# Patient Record
Sex: Female | Born: 1975 | Race: White | Hispanic: No | Marital: Married | Smoking: Never smoker
Health system: Southern US, Community
[De-identification: ages and names within clinical notes are randomized; demographics above are authoritative.]

## PROBLEM LIST (undated history)

## (undated) ENCOUNTER — Inpatient Hospital Stay (HOSPITAL_COMMUNITY): Payer: Self-pay

## (undated) DIAGNOSIS — F329 Major depressive disorder, single episode, unspecified: Secondary | ICD-10-CM

## (undated) DIAGNOSIS — R51 Headache: Secondary | ICD-10-CM

## (undated) DIAGNOSIS — Z8619 Personal history of other infectious and parasitic diseases: Secondary | ICD-10-CM

## (undated) DIAGNOSIS — Q6689 Other  specified congenital deformities of feet: Secondary | ICD-10-CM

## (undated) DIAGNOSIS — Z8759 Personal history of other complications of pregnancy, childbirth and the puerperium: Secondary | ICD-10-CM

## (undated) DIAGNOSIS — F32A Depression, unspecified: Secondary | ICD-10-CM

## (undated) DIAGNOSIS — O24419 Gestational diabetes mellitus in pregnancy, unspecified control: Secondary | ICD-10-CM

## (undated) HISTORY — DX: Headache: R51

## (undated) HISTORY — DX: Gestational diabetes mellitus in pregnancy, unspecified control: O24.419

## (undated) HISTORY — DX: Personal history of other infectious and parasitic diseases: Z86.19

## (undated) HISTORY — DX: Personal history of other complications of pregnancy, childbirth and the puerperium: Z87.59

## (undated) HISTORY — PX: EYE SURGERY: SHX253

## (undated) HISTORY — DX: Other specified congenital deformities of feet: Q66.89

---

## 2010-01-24 ENCOUNTER — Inpatient Hospital Stay (HOSPITAL_COMMUNITY): Admission: AD | Admit: 2010-01-24 | Discharge: 2010-01-26 | Payer: Self-pay | Admitting: Obstetrics and Gynecology

## 2010-02-11 ENCOUNTER — Ambulatory Visit: Admission: RE | Admit: 2010-02-11 | Discharge: 2010-02-11 | Payer: Self-pay | Admitting: Obstetrics and Gynecology

## 2010-12-09 LAB — CBC
HCT: 37.9 % (ref 36.0–46.0)
Hemoglobin: 10.5 g/dL — ABNORMAL LOW (ref 12.0–15.0)
MCHC: 34.7 g/dL (ref 30.0–36.0)
Platelets: 175 10*3/uL (ref 150–400)
RBC: 3.14 MIL/uL — ABNORMAL LOW (ref 3.87–5.11)
WBC: 10.2 10*3/uL (ref 4.0–10.5)
WBC: 12.2 10*3/uL — ABNORMAL HIGH (ref 4.0–10.5)

## 2012-09-21 NOTE — L&D Delivery Note (Signed)
Delivery Note At 2:38 AM a viable female was delivered via  (Presentation: ; Occiput Anterior).  APGAR: 8, 9; weight pending   Placenta status:spontaneously with 3 vessel cord , Spontaneous.  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 Blood Loss (mL): 30  Mom to postpartum.  Baby to nursery-stable.  Jaquelynn Wanamaker L 06/02/2013, 2:56 AM

## 2012-11-11 LAB — OB RESULTS CONSOLE GC/CHLAMYDIA
Chlamydia: NEGATIVE
Gonorrhea: NEGATIVE

## 2012-11-11 LAB — OB RESULTS CONSOLE HIV ANTIBODY (ROUTINE TESTING): HIV: NONREACTIVE

## 2012-11-11 LAB — OB RESULTS CONSOLE ANTIBODY SCREEN: Antibody Screen: NEGATIVE

## 2012-11-11 LAB — OB RESULTS CONSOLE RPR: RPR: NONREACTIVE

## 2012-11-11 LAB — OB RESULTS CONSOLE RUBELLA ANTIBODY, IGM: Rubella: IMMUNE

## 2013-01-09 ENCOUNTER — Other Ambulatory Visit (HOSPITAL_COMMUNITY): Payer: Self-pay | Admitting: Obstetrics and Gynecology

## 2013-01-09 DIAGNOSIS — Z3689 Encounter for other specified antenatal screening: Secondary | ICD-10-CM

## 2013-01-10 ENCOUNTER — Ambulatory Visit (HOSPITAL_COMMUNITY)
Admission: RE | Admit: 2013-01-10 | Discharge: 2013-01-10 | Disposition: A | Discharge status: Home / Self Care | Payer: BC Managed Care – PPO | Source: Ambulatory Visit | Attending: Obstetrics and Gynecology | Admitting: Obstetrics and Gynecology

## 2013-01-10 ENCOUNTER — Encounter (HOSPITAL_COMMUNITY): Payer: Self-pay

## 2013-01-10 DIAGNOSIS — O358XX0 Maternal care for other (suspected) fetal abnormality and damage, not applicable or unspecified: Secondary | ICD-10-CM | POA: Insufficient documentation

## 2013-01-10 DIAGNOSIS — O352XX0 Maternal care for (suspected) hereditary disease in fetus, not applicable or unspecified: Secondary | ICD-10-CM | POA: Insufficient documentation

## 2013-01-10 DIAGNOSIS — Z1389 Encounter for screening for other disorder: Secondary | ICD-10-CM | POA: Insufficient documentation

## 2013-01-10 DIAGNOSIS — O99210 Obesity complicating pregnancy, unspecified trimester: Secondary | ICD-10-CM | POA: Insufficient documentation

## 2013-01-10 DIAGNOSIS — Z363 Encounter for antenatal screening for malformations: Secondary | ICD-10-CM | POA: Insufficient documentation

## 2013-01-10 DIAGNOSIS — Z3689 Encounter for other specified antenatal screening: Secondary | ICD-10-CM

## 2013-01-10 DIAGNOSIS — E669 Obesity, unspecified: Secondary | ICD-10-CM | POA: Insufficient documentation

## 2013-01-16 ENCOUNTER — Other Ambulatory Visit: Payer: Self-pay

## 2013-05-01 DIAGNOSIS — Q6689 Other  specified congenital deformities of feet: Secondary | ICD-10-CM | POA: Insufficient documentation

## 2013-05-01 HISTORY — DX: Other specified congenital deformities of feet: Q66.89

## 2013-05-31 ENCOUNTER — Encounter (HOSPITAL_COMMUNITY): Payer: Self-pay | Admitting: *Deleted

## 2013-05-31 ENCOUNTER — Telehealth (HOSPITAL_COMMUNITY): Payer: Self-pay | Admitting: *Deleted

## 2013-05-31 NOTE — Telephone Encounter (Signed)
Preadmission screen  

## 2013-06-01 ENCOUNTER — Inpatient Hospital Stay (HOSPITAL_COMMUNITY)
Admission: AD | Admit: 2013-06-01 | Discharge: 2013-06-03 | DRG: 373 | Disposition: A | Discharge status: Home / Self Care | Payer: BC Managed Care – PPO | Source: Ambulatory Visit | Attending: Obstetrics and Gynecology | Admitting: Obstetrics and Gynecology

## 2013-06-01 ENCOUNTER — Encounter (HOSPITAL_COMMUNITY): Payer: Self-pay | Admitting: *Deleted

## 2013-06-01 DIAGNOSIS — O358XX0 Maternal care for other (suspected) fetal abnormality and damage, not applicable or unspecified: Secondary | ICD-10-CM | POA: Diagnosis present

## 2013-06-01 LAB — CBC
HCT: 35.7 % — ABNORMAL LOW (ref 36.0–46.0)
Hemoglobin: 12 g/dL (ref 12.0–15.0)
MCH: 30.5 pg (ref 26.0–34.0)
MCHC: 33.6 g/dL (ref 30.0–36.0)
RBC: 3.94 MIL/uL (ref 3.87–5.11)

## 2013-06-01 MED ORDER — PHENYLEPHRINE 40 MCG/ML (10ML) SYRINGE FOR IV PUSH (FOR BLOOD PRESSURE SUPPORT)
80.0000 ug | PREFILLED_SYRINGE | INTRAVENOUS | Status: DC | PRN
  Filled 2013-06-01: qty 5
  Filled 2013-06-01: qty 2

## 2013-06-01 MED ORDER — EPHEDRINE 5 MG/ML INJ
10.0000 mg | INTRAVENOUS | Status: DC | PRN
  Filled 2013-06-01: qty 2

## 2013-06-01 MED ORDER — OXYCODONE-ACETAMINOPHEN 5-325 MG PO TABS: 1.0000 | ORAL_TABLET | ORAL | Status: DC | PRN

## 2013-06-01 MED ORDER — PHENYLEPHRINE 40 MCG/ML (10ML) SYRINGE FOR IV PUSH (FOR BLOOD PRESSURE SUPPORT)
80.0000 ug | PREFILLED_SYRINGE | INTRAVENOUS | Status: DC | PRN
  Filled 2013-06-01: qty 2

## 2013-06-01 MED ORDER — IBUPROFEN 600 MG PO TABS: 600.0000 mg | ORAL_TABLET | Freq: Four times a day (QID) | ORAL | Status: DC | PRN

## 2013-06-01 MED ORDER — OXYTOCIN BOLUS FROM INFUSION: 500.0000 mL | INTRAVENOUS | Status: DC

## 2013-06-01 MED ORDER — ACETAMINOPHEN 325 MG PO TABS: 650.0000 mg | ORAL_TABLET | ORAL | Status: DC | PRN

## 2013-06-01 MED ORDER — OXYTOCIN 40 UNITS IN LACTATED RINGERS INFUSION - SIMPLE MED
62.5000 mL/h | INTRAVENOUS | Status: DC
  Administered 2013-06-02: 999 mL/h via INTRAVENOUS
  Filled 2013-06-01: qty 1000

## 2013-06-01 MED ORDER — LACTATED RINGERS IV SOLN: 500.0000 mL | Freq: Once | INTRAVENOUS | Status: DC

## 2013-06-01 MED ORDER — EPHEDRINE 5 MG/ML INJ
10.0000 mg | INTRAVENOUS | Status: DC | PRN
  Filled 2013-06-01: qty 4
  Filled 2013-06-01: qty 2

## 2013-06-01 MED ORDER — FLEET ENEMA 7-19 GM/118ML RE ENEM: 1.0000 | ENEMA | RECTAL | Status: DC | PRN

## 2013-06-01 MED ORDER — LACTATED RINGERS IV SOLN: 500.0000 mL | INTRAVENOUS | Status: DC | PRN

## 2013-06-01 MED ORDER — DIPHENHYDRAMINE HCL 50 MG/ML IJ SOLN: 12.5000 mg | INTRAMUSCULAR | Status: DC | PRN

## 2013-06-01 MED ORDER — LACTATED RINGERS IV SOLN
INTRAVENOUS | Status: DC
  Administered 2013-06-01: 23:00:00 via INTRAVENOUS

## 2013-06-01 MED ORDER — CITRIC ACID-SODIUM CITRATE 334-500 MG/5ML PO SOLN: 30.0000 mL | ORAL | Status: DC | PRN

## 2013-06-01 MED ORDER — FENTANYL 2.5 MCG/ML BUPIVACAINE 1/10 % EPIDURAL INFUSION (WH - ANES)
14.0000 mL/h | INTRAMUSCULAR | Status: DC | PRN
  Filled 2013-06-01: qty 125

## 2013-06-01 MED ORDER — LIDOCAINE HCL (PF) 1 % IJ SOLN
30.0000 mL | INTRAMUSCULAR | Status: DC | PRN
  Filled 2013-06-01 (×2): qty 30

## 2013-06-01 MED ORDER — ONDANSETRON HCL 4 MG/2ML IJ SOLN: 4.0000 mg | Freq: Four times a day (QID) | INTRAMUSCULAR | Status: DC | PRN

## 2013-06-01 NOTE — MAU Note (Signed)
PT SAYS SHE HURTS BAD SINCE 6 PM.    VE IN OFFICE ON Monday-  1 CM.    DENIES HSV AND MRSA.

## 2013-06-02 ENCOUNTER — Encounter (HOSPITAL_COMMUNITY): Payer: Self-pay | Admitting: Anesthesiology

## 2013-06-02 ENCOUNTER — Inpatient Hospital Stay (HOSPITAL_COMMUNITY): Payer: BC Managed Care – PPO | Admitting: Anesthesiology

## 2013-06-02 LAB — ABO/RH: ABO/RH(D): O POS

## 2013-06-02 LAB — TYPE AND SCREEN: ABO/RH(D): O POS

## 2013-06-02 LAB — RPR: RPR Ser Ql: NONREACTIVE

## 2013-06-02 MED ORDER — PRENATAL MULTIVITAMIN CH
1.0000 | ORAL_TABLET | Freq: Every day | ORAL | Status: DC
  Administered 2013-06-02 – 2013-06-03 (×2): 1 via ORAL
  Filled 2013-06-02 (×2): qty 1

## 2013-06-02 MED ORDER — DIPHENHYDRAMINE HCL 25 MG PO CAPS: 25.0000 mg | ORAL_CAPSULE | Freq: Four times a day (QID) | ORAL | Status: DC | PRN

## 2013-06-02 MED ORDER — LIDOCAINE HCL (PF) 1 % IJ SOLN
INTRAMUSCULAR | Status: DC | PRN
  Administered 2013-06-02 (×2): 9 mL

## 2013-06-02 MED ORDER — BENZOCAINE-MENTHOL 20-0.5 % EX AERO
1.0000 "application " | INHALATION_SPRAY | CUTANEOUS | Status: DC | PRN
  Administered 2013-06-03: 1 via TOPICAL
  Filled 2013-06-02 (×2): qty 56

## 2013-06-02 MED ORDER — OXYCODONE-ACETAMINOPHEN 5-325 MG PO TABS: 1.0000 | ORAL_TABLET | ORAL | Status: DC | PRN

## 2013-06-02 MED ORDER — ZOLPIDEM TARTRATE 5 MG PO TABS: 5.0000 mg | ORAL_TABLET | Freq: Every evening | ORAL | Status: DC | PRN

## 2013-06-02 MED ORDER — ONDANSETRON HCL 4 MG/2ML IJ SOLN: 4.0000 mg | INTRAMUSCULAR | Status: DC | PRN

## 2013-06-02 MED ORDER — MEASLES, MUMPS & RUBELLA VAC ~~LOC~~ INJ: 0.5000 mL | INJECTION | Freq: Once | SUBCUTANEOUS | Status: DC

## 2013-06-02 MED ORDER — ONDANSETRON HCL 4 MG PO TABS: 4.0000 mg | ORAL_TABLET | ORAL | Status: DC | PRN

## 2013-06-02 MED ORDER — SIMETHICONE 80 MG PO CHEW: 80.0000 mg | CHEWABLE_TABLET | ORAL | Status: DC | PRN

## 2013-06-02 MED ORDER — INFLUENZA VAC SPLIT QUAD 0.5 ML IM SUSP
0.5000 mL | INTRAMUSCULAR | Status: AC
  Administered 2013-06-03: 0.5 mL via INTRAMUSCULAR
  Filled 2013-06-02: qty 0.5

## 2013-06-02 MED ORDER — BISACODYL 10 MG RE SUPP: 10.0000 mg | Freq: Every day | RECTAL | Status: DC | PRN

## 2013-06-02 MED ORDER — SENNOSIDES-DOCUSATE SODIUM 8.6-50 MG PO TABS
2.0000 | ORAL_TABLET | ORAL | Status: DC
  Administered 2013-06-03: 2 via ORAL

## 2013-06-02 MED ORDER — LANOLIN HYDROUS EX OINT: TOPICAL_OINTMENT | CUTANEOUS | Status: DC | PRN

## 2013-06-02 MED ORDER — MEDROXYPROGESTERONE ACETATE 150 MG/ML IM SUSP: 150.0000 mg | INTRAMUSCULAR | Status: DC | PRN

## 2013-06-02 MED ORDER — DIBUCAINE 1 % RE OINT: 1.0000 "application " | TOPICAL_OINTMENT | RECTAL | Status: DC | PRN

## 2013-06-02 MED ORDER — FENTANYL 2.5 MCG/ML BUPIVACAINE 1/10 % EPIDURAL INFUSION (WH - ANES)
INTRAMUSCULAR | Status: DC | PRN
  Administered 2013-06-02: 14 mL/h via EPIDURAL

## 2013-06-02 MED ORDER — IBUPROFEN 600 MG PO TABS
600.0000 mg | ORAL_TABLET | Freq: Four times a day (QID) | ORAL | Status: DC
  Administered 2013-06-02 – 2013-06-03 (×5): 600 mg via ORAL
  Filled 2013-06-02 (×5): qty 1

## 2013-06-02 MED ORDER — FLEET ENEMA 7-19 GM/118ML RE ENEM: 1.0000 | ENEMA | Freq: Every day | RECTAL | Status: DC | PRN

## 2013-06-02 MED ORDER — TETANUS-DIPHTH-ACELL PERTUSSIS 5-2.5-18.5 LF-MCG/0.5 IM SUSP: 0.5000 mL | Freq: Once | INTRAMUSCULAR | Status: DC

## 2013-06-02 MED ORDER — WITCH HAZEL-GLYCERIN EX PADS: 1.0000 "application " | MEDICATED_PAD | CUTANEOUS | Status: DC | PRN

## 2013-06-02 NOTE — Progress Notes (Signed)
Rn called to room; Mom reported plum sized cloth passed in pad. Fundus firm and at umbilicus. Lochia within normal limits. Clot with appearance of possible placental tissue. RN to advise MD of situation.

## 2013-06-02 NOTE — Anesthesia Procedure Notes (Signed)
Epidural Patient location during procedure: OB Start time: 06/02/2013 12:15 AM End time: 06/02/2013 12:21 AM  Staffing Anesthesiologist: Sandrea Hughs Performed by: anesthesiologist   Preanesthetic Checklist Completed: patient identified, surgical consent, pre-op evaluation, timeout performed, IV checked, risks and benefits discussed and monitors and equipment checked  Epidural Patient position: sitting Prep: site prepped and draped and DuraPrep Patient monitoring: continuous pulse ox and blood pressure Approach: midline Injection technique: LOR air  Needle:  Needle type: Tuohy  Needle gauge: 17 G Needle length: 9 cm and 9 Needle insertion depth: 7 cm Catheter type: closed end flexible Catheter size: 19 Gauge Catheter at skin depth: 12 cm Test dose: negative and Other  Assessment Sensory level: T9 Events: blood not aspirated, injection not painful, no injection resistance, negative IV test and paresthesia  Additional Notes L leg X 3Reason for block:procedure for pain

## 2013-06-02 NOTE — Anesthesia Postprocedure Evaluation (Signed)
  Anesthesia Post-op Note  Patient: Miranda Herrera  Procedure(s) Performed: * No procedures listed *  Patient Location: Mother/Baby  Anesthesia Type:Epidural  Level of Consciousness: awake and alert   Airway and Oxygen Therapy: Patient Spontanous Breathing  Post-op Pain: mild  Post-op Assessment: Patient's Cardiovascular Status Stable, Respiratory Function Stable, No signs of Nausea or vomiting, Pain level controlled, No headache, No residual numbness and No residual motor weakness  Post-op Vital Signs: stable  Complications: No apparent anesthesia complications

## 2013-06-02 NOTE — H&P (Signed)
  37 year old G 2 P 1001 at term admitted last pm in active labor - 4 cm dilated.  PNC see [facility]  NSVD x 1  GBBS is negative  Afebrile VSS General alert and oriented Lung CTAB Car RRR Abdomen is soft and non tender  Cervix as above  IMPRESSION: IUP at term Labor  PLAN: NSVD

## 2013-06-02 NOTE — Anesthesia Preprocedure Evaluation (Signed)
Anesthesia Evaluation  Patient identified by MRN, date of birth, ID band Patient awake    Reviewed: Allergy & Precautions, H&P , NPO status , Patient's Chart, lab work & pertinent test results  Airway Mallampati: III TM Distance: >3 FB Neck ROM: full    Dental no notable dental hx.    Pulmonary neg pulmonary ROS,    Pulmonary exam normal       Cardiovascular negative cardio ROS      Neuro/Psych negative psych ROS   GI/Hepatic negative GI ROS, Neg liver ROS,   Endo/Other  Morbid obesity  Renal/GU negative Renal ROS  negative genitourinary   Musculoskeletal   Abdominal (+) + obese,   Peds  Hematology negative hematology ROS (+)   Anesthesia Other Findings   Reproductive/Obstetrics (+) Pregnancy                           Anesthesia Physical Anesthesia Plan  ASA: III  Anesthesia Plan: Epidural   Post-op Pain Management:    Induction:   Airway Management Planned:   Additional Equipment:   Intra-op Plan:   Post-operative Plan:   Informed Consent: I have reviewed the patients History and Physical, chart, labs and discussed the procedure including the risks, benefits and alternatives for the proposed anesthesia with the patient or authorized representative who has indicated his/her understanding and acceptance.     Plan Discussed with:   Anesthesia Plan Comments:         Anesthesia Quick Evaluation

## 2013-06-02 NOTE — Progress Notes (Signed)
Post Partum Day 0 Subjective: no complaints, up ad lib, voiding and tolerating PO  Objective: Blood pressure 108/68, pulse 76, temperature 98 F (36.7 C), temperature source Oral, resp. rate 18, height 5\' 4"  (1.626 m), weight 300 lb 4 oz (136.193 kg), last menstrual period 08/24/2012, SpO2 98.00%, unknown if currently breastfeeding.  Physical Exam:  General: alert and cooperative Lochia: appropriate Uterine Fundus: firm Incision: perineum intact DVT Evaluation: No evidence of DVT seen on physical exam. Negative Homan's sign. No cords or calf tenderness. No significant calf/ankle edema.   Recent Labs  06/01/13 2230  HGB 12.0  HCT 35.7*    Assessment/Plan: Plan for discharge tomorrow   LOS: 1 day   Camdin Hegner G 06/02/2013, 8:12 AM

## 2013-06-02 NOTE — Progress Notes (Signed)
Per MD telephone call. Ok to discard clot; continue to observe for unusual bleeding or continued clots.

## 2013-06-03 LAB — CBC
HCT: 31.7 % — ABNORMAL LOW (ref 36.0–46.0)
Platelets: 176 10*3/uL (ref 150–400)
RBC: 3.44 MIL/uL — ABNORMAL LOW (ref 3.87–5.11)
RDW: 14.9 % (ref 11.5–15.5)
WBC: 10 10*3/uL (ref 4.0–10.5)

## 2013-06-03 MED ORDER — IBUPROFEN 600 MG PO TABS: 600.0000 mg | ORAL_TABLET | Freq: Four times a day (QID) | ORAL | Status: DC

## 2013-06-03 NOTE — Discharge Summary (Signed)
Obstetric Discharge Summary Reason for Admission: onset of labor Prenatal Procedures: ultrasound Intrapartum Procedures: spontaneous vaginal delivery Postpartum Procedures: none Complications-Operative and Postpartum: 2nd degree perineal laceration Hemoglobin  Date Value Range Status  06/03/2013 10.7* 12.0 - 15.0 g/dL Final     HCT  Date Value Range Status  06/03/2013 31.7* 36.0 - 46.0 % Final    Physical Exam:  General: alert and cooperative Lochia: appropriate Uterine Fundus: firm Incision: healing well, no significant drainage DVT Evaluation: No evidence of DVT seen on physical exam.  Discharge Diagnoses: Term Pregnancy-delivered  Discharge Information: Date: 06/03/2013 Activity: pelvic rest Diet: routine Medications: PNV and Ibuprofen Condition: stable Instructions: refer to practice specific booklet Discharge to: home Follow-up Information   Schedule an appointment as soon as possible for a visit in 6 weeks to follow up.      Newborn Data: Live born female  Birth Weight: 8 lb 1.3 oz (3666 g) APGAR: 8, 9  Home with mother.  Miranda Herrera 06/03/2013, 9:13 AM

## 2013-06-03 NOTE — Lactation Note (Signed)
This note was copied from the chart of Miranda Herrera. Lactation Consultation Note MD request feeding assessment. Mom has baby at the left breast, cradle hold, when I enter room. Mom comfortable, denies nipple pain, baby is latched with a deep latch, rhythmic sucking, and frequent audible swallowing.  Discussed supply/ demand, milk coming in, STS and frequent cue based feeding. Enc mom to call lactation office if she has any concerns, and to attend the BFSG.  Patient Name: Miranda Herrera [id number] Date: 06/03/2013 Reason for consult: Follow-up assessment   Maternal Data    Feeding Feeding Type: Breast Milk Length of feed: 15 min  LATCH Score/Interventions Latch: Grasps breast easily, tongue down, lips flanged, rhythmical sucking.  Audible Swallowing: Spontaneous and intermittent  Type of Nipple: Everted at rest and after stimulation  Comfort (Breast/Nipple): Soft / non-tender     Hold (Positioning): No assistance needed to correctly position infant at breast.  LATCH Score: 10  Lactation Tools Discussed/Used     Consult Status Consult Status: Complete    Lenard Forth 06/03/2013, 12:27 PM

## 2013-06-08 ENCOUNTER — Inpatient Hospital Stay (HOSPITAL_COMMUNITY): Admission: RE | Admit: 2013-06-08 | Payer: BC Managed Care – PPO | Source: Ambulatory Visit

## 2014-07-23 ENCOUNTER — Encounter (HOSPITAL_COMMUNITY): Payer: Self-pay | Admitting: Anesthesiology

## 2016-09-21 NOTE — L&D Delivery Note (Signed)
Delivery Note At 1:18 PM a viable female was delivered via Vaginal, Spontaneous Delivery (Presentation: OA to LOT ; FOB assisted with body  ).  APGAR: 7, 9; weight 7lbs 8.3 oz .   Placenta status: S/C/I, for diaposal in L&D .  Cord:  with the following complications: CAN x 1, reduced on perineum.  Cord blood collected for typing Moderate meconium noted, minimal amniotic fluid.   Anesthesia:  None Nitrous oxide for transition Episiotomy: None Lacerations: 2nd degree Suture Repair: 2.0 3.0 vicryl Est. Blood Loss (mL): 200  Mom to postpartum.  Baby to Couplet care / Skin to Skin.  Neta MendsDaniela C Harmani Neto, CNM 05/23/2017, 2:01 PM

## 2016-11-05 LAB — OB RESULTS CONSOLE HEPATITIS B SURFACE ANTIGEN: HEP B S AG: NEGATIVE

## 2016-11-05 LAB — OB RESULTS CONSOLE ABO/RH: RH TYPE: POSITIVE

## 2016-11-05 LAB — OB RESULTS CONSOLE GBS: GBS: NEGATIVE

## 2016-11-05 LAB — OB RESULTS CONSOLE RPR: RPR: NONREACTIVE

## 2016-11-05 LAB — OB RESULTS CONSOLE ANTIBODY SCREEN: Antibody Screen: NEGATIVE

## 2016-11-05 LAB — OB RESULTS CONSOLE RUBELLA ANTIBODY, IGM: RUBELLA: IMMUNE

## 2016-11-05 LAB — OB RESULTS CONSOLE HIV ANTIBODY (ROUTINE TESTING): HIV: NONREACTIVE

## 2017-05-23 ENCOUNTER — Inpatient Hospital Stay (HOSPITAL_COMMUNITY)
Admission: AD | Admit: 2017-05-23 | Discharge: 2017-05-24 | DRG: 775 | Disposition: A | Discharge status: Home / Self Care | Payer: Self-pay | Source: Ambulatory Visit

## 2017-05-23 DIAGNOSIS — Z8759 Personal history of other complications of pregnancy, childbirth and the puerperium: Secondary | ICD-10-CM

## 2017-05-23 DIAGNOSIS — O99824 Streptococcus B carrier state complicating childbirth: Secondary | ICD-10-CM | POA: Diagnosis present

## 2017-05-23 DIAGNOSIS — O24420 Gestational diabetes mellitus in childbirth, diet controlled: Principal | ICD-10-CM | POA: Diagnosis present

## 2017-05-23 DIAGNOSIS — Z3A40 40 weeks gestation of pregnancy: Secondary | ICD-10-CM

## 2017-05-23 HISTORY — DX: Personal history of other complications of pregnancy, childbirth and the puerperium: Z87.59

## 2017-05-23 LAB — CBC
HCT: 36 % (ref 36.0–46.0)
Hemoglobin: 12.6 g/dL (ref 12.0–15.0)
MCH: 32.9 pg (ref 26.0–34.0)
MCHC: 35 g/dL (ref 30.0–36.0)
MCV: 94 fL (ref 78.0–100.0)
PLATELETS: 247 10*3/uL (ref 150–400)
RBC: 3.83 MIL/uL — ABNORMAL LOW (ref 3.87–5.11)
RDW: 14.2 % (ref 11.5–15.5)
WBC: 16.3 10*3/uL — ABNORMAL HIGH (ref 4.0–10.5)

## 2017-05-23 LAB — TYPE AND SCREEN
ABO/RH(D): O POS
ANTIBODY SCREEN: NEGATIVE

## 2017-05-23 MED ORDER — OXYTOCIN 40 UNITS IN LACTATED RINGERS INFUSION - SIMPLE MED: 2.5000 [IU]/h | INTRAVENOUS | Status: DC

## 2017-05-23 MED ORDER — ACETAMINOPHEN 325 MG PO TABS: 650.0000 mg | ORAL_TABLET | ORAL | Status: DC | PRN

## 2017-05-23 MED ORDER — OXYTOCIN 10 UNIT/ML IJ SOLN
INTRAMUSCULAR | Status: AC
  Filled 2017-05-23: qty 1

## 2017-05-23 MED ORDER — BISACODYL 10 MG RE SUPP
10.0000 mg | Freq: Every day | RECTAL | Status: DC | PRN
  Filled 2017-05-23: qty 1

## 2017-05-23 MED ORDER — BENZOCAINE-MENTHOL 20-0.5 % EX AERO
1.0000 "application " | INHALATION_SPRAY | CUTANEOUS | Status: DC | PRN
  Administered 2017-05-23: 1 via TOPICAL
  Filled 2017-05-23: qty 56

## 2017-05-23 MED ORDER — OXYCODONE-ACETAMINOPHEN 5-325 MG PO TABS
1.0000 | ORAL_TABLET | ORAL | Status: DC | PRN
Start: 2017-05-23 — End: 2017-05-23

## 2017-05-23 MED ORDER — LIDOCAINE HCL (PF) 1 % IJ SOLN: 30.0000 mL | INTRAMUSCULAR | Status: DC | PRN

## 2017-05-23 MED ORDER — COCONUT OIL OIL: 1.0000 "application " | TOPICAL_OIL | Status: DC | PRN

## 2017-05-23 MED ORDER — ONDANSETRON HCL 4 MG/2ML IJ SOLN: 4.0000 mg | Freq: Four times a day (QID) | INTRAMUSCULAR | Status: DC | PRN

## 2017-05-23 MED ORDER — SOD CITRATE-CITRIC ACID 500-334 MG/5ML PO SOLN: 30.0000 mL | ORAL | Status: DC | PRN

## 2017-05-23 MED ORDER — LIDOCAINE HCL (PF) 1 % IJ SOLN
INTRAMUSCULAR | Status: AC
  Filled 2017-05-23: qty 30

## 2017-05-23 MED ORDER — OXYTOCIN BOLUS FROM INFUSION: 500.0000 mL | Freq: Once | INTRAVENOUS | Status: DC

## 2017-05-23 MED ORDER — SODIUM CHLORIDE 0.9 % IV SOLN: 250.0000 mL | INTRAVENOUS | Status: DC | PRN

## 2017-05-23 MED ORDER — ONDANSETRON HCL 4 MG/2ML IJ SOLN: 4.0000 mg | INTRAMUSCULAR | Status: DC | PRN

## 2017-05-23 MED ORDER — OXYCODONE-ACETAMINOPHEN 5-325 MG PO TABS: 2.0000 | ORAL_TABLET | ORAL | Status: DC | PRN

## 2017-05-23 MED ORDER — OXYTOCIN 10 UNIT/ML IJ SOLN: 10.0000 [IU] | Freq: Once | INTRAMUSCULAR | Status: DC

## 2017-05-23 MED ORDER — ZOLPIDEM TARTRATE 5 MG PO TABS: 5.0000 mg | ORAL_TABLET | Freq: Every evening | ORAL | Status: DC | PRN

## 2017-05-23 MED ORDER — SIMETHICONE 80 MG PO CHEW: 80.0000 mg | CHEWABLE_TABLET | ORAL | Status: DC | PRN

## 2017-05-23 MED ORDER — LACTATED RINGERS IV SOLN: Freq: Once | INTRAVENOUS | Status: DC

## 2017-05-23 MED ORDER — LACTATED RINGERS IV SOLN: 500.0000 mL | INTRAVENOUS | Status: DC | PRN

## 2017-05-23 MED ORDER — DIBUCAINE 1 % RE OINT: 1.0000 "application " | TOPICAL_OINTMENT | RECTAL | Status: DC | PRN

## 2017-05-23 MED ORDER — SODIUM CHLORIDE 0.9% FLUSH: 3.0000 mL | INTRAVENOUS | Status: DC | PRN

## 2017-05-23 MED ORDER — TETANUS-DIPHTH-ACELL PERTUSSIS 5-2.5-18.5 LF-MCG/0.5 IM SUSP
0.5000 mL | Freq: Once | INTRAMUSCULAR | Status: AC
  Administered 2017-05-24: 0.5 mL via INTRAMUSCULAR
  Filled 2017-05-23 (×2): qty 0.5

## 2017-05-23 MED ORDER — SODIUM CHLORIDE 0.9% FLUSH: 3.0000 mL | Freq: Two times a day (BID) | INTRAVENOUS | Status: DC

## 2017-05-23 MED ORDER — SENNOSIDES-DOCUSATE SODIUM 8.6-50 MG PO TABS
2.0000 | ORAL_TABLET | ORAL | Status: DC
  Administered 2017-05-24: 2 via ORAL
  Filled 2017-05-23: qty 2

## 2017-05-23 MED ORDER — IBUPROFEN 600 MG PO TABS
600.0000 mg | ORAL_TABLET | Freq: Four times a day (QID) | ORAL | Status: DC
  Administered 2017-05-23 – 2017-05-24 (×4): 600 mg via ORAL
  Filled 2017-05-23 (×4): qty 1

## 2017-05-23 MED ORDER — ONDANSETRON HCL 4 MG PO TABS
4.0000 mg | ORAL_TABLET | ORAL | Status: DC | PRN
Start: 2017-05-23 — End: 2017-05-24

## 2017-05-23 MED ORDER — DIPHENHYDRAMINE HCL 25 MG PO CAPS
25.0000 mg | ORAL_CAPSULE | Freq: Four times a day (QID) | ORAL | Status: DC | PRN
Start: 2017-05-23 — End: 2017-05-24

## 2017-05-23 MED ORDER — WITCH HAZEL-GLYCERIN EX PADS: 1.0000 "application " | MEDICATED_PAD | CUTANEOUS | Status: DC | PRN

## 2017-05-23 MED ORDER — FLEET ENEMA 7-19 GM/118ML RE ENEM
1.0000 | ENEMA | Freq: Every day | RECTAL | Status: DC | PRN
Start: 2017-05-23 — End: 2017-05-24

## 2017-05-23 MED ORDER — PRENATAL MULTIVITAMIN CH
1.0000 | ORAL_TABLET | Freq: Every day | ORAL | Status: DC
  Administered 2017-05-24: 1 via ORAL
  Filled 2017-05-23: qty 1

## 2017-05-23 NOTE — H&P (Signed)
OB ADMISSION/ HISTORY & PHYSICAL:  Admission Date: 05/23/2017 12:43 PM  Admit Diagnosis: term pregnancy in active labor   Miranda PigeonMarissa Schadler is a 41 y.o. female presenting for ctx. [facility], variable decels on EFM and minimal to decreased variability. Risk out of BC birth and sent to hospital for direct admit. Ctx onset yesterday, irregular, more organized since 1030 today. Now with intense ctx since arrival at hospital, was planning waterbirth, suspect precip active stage.   Prenatal History: [id number] EDC : 05/17/2017, by Patient Reported  [facility] 12 wks  Prenatal course complicated by AMA., GDM A1 well controlled w/ diet and exercise, GBS positive   Prenatal Labs: ABO, Rh: O (02/15 0000)  Antibody: Negative (02/15 0000) Rubella: Immune (02/15 0000)  RPR: Nonreactive (02/15 0000)  HBsAg: Negative (02/15 0000)  HIV: Non-reactive (02/15 0000)  GBS: Positive DM screen - abnormal fasting CBG self screen  Anatomy US, normal female anatomy, AGA Medical / Surgical History :  Past medical history:  Past Medical History:  Diagnosis Date  . Headache(784.0)   . Hx of varicella      Past surgical history:  Past Surgical History:  Procedure Laterality Date  . EYE SURGERY     lasik     Family History:  Family History  Problem Relation Age of Onset  . Diabetes Sister   . Birth defects Sister        clubbed feet  . Diabetes Maternal Uncle   . Diabetes Paternal Grandmother   . Cancer Paternal Grandmother        ovarian     Social History:  reports that she has never smoked. She has never used smokeless tobacco. She reports that she does not drink alcohol or use drugs.   Allergies: Patient has no known allergies.    Current Medications at time of admission:  Prescriptions Prior to Admission  Medication Sig Dispense Refill Last Dose  . calcium carbonate (TUMS - DOSED IN MG ELEMENTAL CALCIUM) 500 MG chewable  tablet Chew 2 tablets by mouth 2 (two) times daily as needed for heartburn.   05/23/2017  . Prenatal Vit-Fe Fumarate-FA (PRENATAL MULTIVITAMIN) TABS tablet Take 1 tablet by mouth daily at 12 noon.   05/23/2017  . [DISCONTINUED] ibuprofen (ADVIL,MOTRIN) 600 MG tablet Take 1 tablet (600 mg total) by mouth every 6 (six) hours. 30 tablet 0       Review of Systems: ROS   Physical Exam:  Dilation: 7 Effacement (%): 80 Station:-1 Exam by:: Burnadette PeterPaul  [facility]  General: AAO x 3, breathing and crying w/ Ctx Heart: RRR Lungs: CTAB Abdomen: gravid, NT Extremities: trace edema FHR: 140, mod var, no accels, no decels TOCO: q 2-3 min, strong  Labs:    No results for input(s): WBC, HGB, HCT, PLT in the last 72 hours.     Assessment:  41 y.o. G3P3003 at 10714w6d, GDM a1  1. Active stage of labor 2. [facility], now category 1 3. GBS pos 4. Desires natural childbirth 5. Breastfeeding 6. Requests placenta - declines  Plan:  1. Admit to BS 2. Routine L&D orders 3. Nitrous gas for transition 4. Expectant management 5. Anticipate impending NSVB, prepare room for delivery Will defer GBS prophylaxis - risk based criteria low.   Dr Amado NashAlmquist notified of admission / plan of care   Neta MendsDaniela C Aiya Keach CNM, MSN 05/23/2017, 3:40 PM

## 2017-05-23 NOTE — Progress Notes (Signed)
[facility]  On admission pt is laboring and appears to be in transition.  SVE is 6-7cm intact, no bleeding, pt is in BR needing to urinate.

## 2017-05-24 LAB — CBC
HCT: 30.8 % — ABNORMAL LOW (ref 36.0–46.0)
HEMOGLOBIN: 11 g/dL — AB (ref 12.0–15.0)
MCH: 33.5 pg (ref 26.0–34.0)
MCHC: 35.7 g/dL (ref 30.0–36.0)
MCV: 93.9 fL (ref 78.0–100.0)
Platelets: 216 10*3/uL (ref 150–400)
RBC: 3.28 MIL/uL — AB (ref 3.87–5.11)
RDW: 14.4 % (ref 11.5–15.5)
WBC: 11.5 10*3/uL — AB (ref 4.0–10.5)

## 2017-05-24 LAB — RPR: RPR Ser Ql: NONREACTIVE

## 2017-05-24 MED ORDER — IBUPROFEN 600 MG PO TABS: 600.0000 mg | ORAL_TABLET | Freq: Four times a day (QID) | ORAL | 0 refills | Status: DC

## 2017-05-24 NOTE — Lactation Note (Signed)
This note was copied from a baby's chart. Lactation Consultation Note  Patient Name: Miranda Herrera BJYNW'GToday's Date: 05/24/2017 Reason for consult: Follow-up assessment Inf[facility] is 2423 hours old & [facility]. Baby was asleep with mom when LC entered. Mom reports that baby had just fed for ~5 mins and then she fell asleep. Mom tried latching baby again with LC in room in cradle hold on left breast but baby cried and would not latch. Suggested mom try latching baby in cross-cradle hold and to hand express prior to latch. Mom was able to hand express drops easily. Mom checked baby's diaper and realized baby had a stool diaper. After changing her, mom tried again to latch her but baby would not open her mouth to latch. RN & then lab came into room so mom stopped trying to latch baby. Mom encouraged to feed baby 8-12 times/24 hours and with feeding cues. Suggested mom try skin-to-skin before attempting to latch. Mom reports she will be going back to work and has a Medela pump. Reviewed engorgement care. Mom reports no questions. Encouraged mom to ask for help as needed.  Maternal Data    Feeding Feeding Type: Breast Fed Length of feed: 30 min  LATCH Score Latch: Too sleepy or reluctant, no latch achieved, no sucking elicited.  Audible Swallowing: None  Type of Nipple: Everted at rest and after stimulation  Comfort (Breast/Nipple): Soft / non-tender  Hold (Positioning): Assistance needed to correctly position infant at breast and maintain latch.  LATCH Score: 5  Interventions Interventions: Breast feeding basics reviewed;Hand express;Position options;Support pillows  Lactation Tools Discussed/Used     Consult Status Consult Status: Follow-up Date: 05/25/17 Follow-up type: In-patient    [name] 05/24/2017, 1:36 PM

## 2017-05-24 NOTE — Progress Notes (Signed)
PPD 1 SVD with 2nd degree repair  S:  Reports feeling well             Tolerating po/ No nausea or vomiting             Bleeding is light             Pain controlled with motrin             Up ad lib / ambulatory / voiding QS  Newborn breast feeding  / female  O:  VS: BP 120/67 (BP Location: Right Arm)   Pulse 79   Temp 98.1 F (36.7 C) (Oral)   Resp 16   Ht 5\' 6"  (1.676 m)   Wt 112.5 kg (248 lb)   Breastfeeding? Unknown   BMI 40.03 kg/m    LABS:              Recent Labs  05/23/17 1752 05/24/17 0524  WBC 16.3* 11.5*  HGB 12.6 11.0*  PLT 247 216               Blood type: --/--/O POS (09/02 1752)  Rubella: Immune (02/15 0000)                          Physical Exam:             Alert and oriented X3  Abdomen: soft, non-tender, non-distended              Fundus: firm, non-tender, U-1  Perineum: no edema  Lochia: light  Extremities: no edema, no calf pain or tenderness   A: PPD # 1 SVD with 2nd degree repair             GDMa1 - delivered   Doing well - stable status  P: Routine post partum orders  DC home  Marlinda MikeBAILEY, Mihailo Sage CNM, MSN, [facility] 05/24/2017, 10:19 AM

## 2017-05-24 NOTE — Discharge Summary (Signed)
Obstetric Discharge Summary Reason for Admission: onset of labor Prenatal Procedures: NST Intrapartum Procedures: spontaneous vaginal delivery Postpartum Procedures: none Complications-Operative and Postpartum: 2nd degree perineal laceration Hemoglobin  Date Value Ref Range Status  05/24/2017 11.0 (L) 12.0 - 15.0 g/dL Final   HCT  Date Value Ref Range Status  05/24/2017 30.8 (L) 36.0 - 46.0 % Final    Physical Exam:  General: alert, cooperative and no distress Lochia: appropriate Uterine Fundus: firm Perineum: no edema DVT Evaluation: No evidence of DVT seen on physical exam.  Discharge Diagnoses: Term Pregnancy-delivered / GDMa1 - delivered  Discharge Information: Date: 05/24/2017 Activity: pelvic rest Diet: routine Medications: PNV and Ibuprofen Condition: stable Instructions: refer to practice specific booklet / sugar test 6-12 weeks postpartum Discharge to: home Follow-up Information    Miranda Herrera, Miranda Herrera, Miranda Herrera. Schedule an appointment as soon as possible for a visit in 2 week(s).   Specialty:  Obstetrics and Gynecology Contact information: [address] [place] [address] [phone]           Newborn Data: Live born female  Birth Weight: 7 lb 8.3 oz (3410 g) APGAR: 7, 9  Home with mother.  Miranda Herrera, Miranda Herrera 05/24/2017, 11:01 AM

## 2017-05-24 NOTE — Lactation Note (Signed)
This note was copied from a baby's chart. Lactation Consultation Note Experienced BF has 2341yr old that she BF for 10 months and 674 yr old daughter for 8 months. Denies difficulty.  Mom has everted small nipples. Mom hand expressed colostrum.  Mom encouraged to feed baby 8-12 times/24 hours and with feeding cues.  Encouraged to call for questions or assistance.  WH/LC brochure given w/resources, support groups and LC services. Patient Name: Miranda Herrera Today's Date: 05/24/2017 Reason for consult: Initial assessment   Maternal Data Has patient been taught Hand Expression?: Yes Does the patient have breastfeeding experience prior to this delivery?: Yes  Feeding Feeding Type: Breast Fed Length of feed: 10 min  LATCH Score       Type of Nipple: Everted at rest and after stimulation  Comfort (Breast/Nipple): Soft / non-tender        Interventions Interventions: Breast feeding basics reviewed  Lactation Tools Discussed/Used     Consult Status Consult Status: Follow-up Date: 05/25/14 Follow-up type: In-patient    Tyson Parkison, Diamond NickelLAURA G 05/24/2017, 5:20 AM

## 2017-09-21 NOTE — L&D Delivery Note (Signed)
IOL for A2DM: cytotec, foley, AROM, pitocin   Delivery Note Progressed quickly after AROM to C/C/0 w/urge to push.  After a 2 ctx 2nd stage, at 9:23 PM a viable female was delivered via Vaginal, Spontaneous (Presentation: LOA  ). Delivery assisted by FOB. APGAR: 9, 9; weight pending.  After 1 minute, the cord was clamped and cut. 40 units of pitocin diluted in 1000cc LR was infused rapidly IV.  The placenta separated spontaneously and delivered via CCT and maternal pushing effort.  It was inspected and appears to be intact with a 3 VC.      Anesthesia:  Epidural/local Episiotomy: None Lacerations: 2nd degree;Perineal Suture Repair: 2.0 vicryl Est. Blood Loss (mL): 161  Mom to postpartum.  Baby to Couplet care / Skin to Skin.  Miranda CalicoFrances Herrera 09/15/2018, 10:19 PM

## 2018-02-16 ENCOUNTER — Ambulatory Visit
Admission: EM | Admit: 2018-02-16 | Discharge: 2018-02-16 | Disposition: A | Discharge status: Home / Self Care | Payer: Self-pay | Attending: Family Medicine | Admitting: Family Medicine

## 2018-02-16 ENCOUNTER — Other Ambulatory Visit: Payer: Self-pay

## 2018-02-16 ENCOUNTER — Encounter: Payer: Self-pay | Admitting: Emergency Medicine

## 2018-02-16 DIAGNOSIS — J0100 Acute maxillary sinusitis, unspecified: Secondary | ICD-10-CM

## 2018-02-16 DIAGNOSIS — R059 Cough, unspecified: Secondary | ICD-10-CM

## 2018-02-16 DIAGNOSIS — R05 Cough: Secondary | ICD-10-CM

## 2018-02-16 MED ORDER — AMOXICILLIN-POT CLAVULANATE 875-125 MG PO TABS: 1.0000 | ORAL_TABLET | Freq: Two times a day (BID) | ORAL | 0 refills | Status: DC

## 2018-02-16 NOTE — Discharge Instructions (Addendum)
Take medication as prescribed. Rest. Drink plenty of fluids.  ° °Follow up with your primary care physician this week as needed. Return to Urgent care for new or worsening concerns.  ° °

## 2018-02-16 NOTE — ED Triage Notes (Signed)
Patient c/o cough symptoms x 1 month. Patient now reports upper back pain with cough.

## 2018-02-16 NOTE — ED Provider Notes (Signed)
[facility] ____________________________________________  Time seen: Approximately 4:00 PM  I have reviewed the triage vital signs and the nursing notes.   HISTORY  Chief Complaint Cough  HPI Miranda Herrera is a 42 y.o. female presenting for evaluation of 3-4 weeks of cough and congestion symptoms.  Patient states that she continues with nasal congestion and some sinus pressure as well as cough.  States nasal drainage as well as cough production is yellowish and greenish.  States that she has had intermittent subjective fevers, not measured.  Has not been taking any over-the-counter medication except for twice took Tylenol.  Also states that she has some right-sided back pain that comes and goes, and states is present with movement as well as sometimes at rest.  States sometimes she wakes up she can feel pain to the her right back.  Patient however reports that she believes that she has had that right pain intermittently for months and that massage and warm compresses improve it.  Reports right back pain does intermittently go away.  No fall or injury.  Denies hemoptysis.  States that she does have intermittent shortness of breath, stating primarily when going upstairs or coughing, and reports that is only been present for the last few weeks.  Patient states that she is approximately [redacted] weeks  pregnant, and states that "I always feel short of breath when pregnant ."  Denies current shortness of breath.  Denies current chest pain no history of PE or blood clots, recent immobilization.  His continue to remain active.  Gravida 4 para 3.  Denies any associated abdominal pain, vaginal discharge, vaginal bleeding or vaginal leaking.  Reports otherwise feels well.  Denies chest pain, abdominal pain, dysuria, extremity pain, extremity swelling or rash. Denies recent sickness. Denies recent antibiotic use.    Patient's last menstrual period was 12/22/2017 (approximate).  Past Medical History:    Diagnosis Date  . Headache(784.0)   . Hx of varicella     Patient Active Problem List   Diagnosis Date Noted  . Labor, precipitous, delivered 05/23/2017  . Postpartum care following vaginal delivery 9/2 05/23/2017  . Second-degree perineal laceration, with delivery 05/23/2017    Past Surgical History:  Procedure Laterality Date  . EYE SURGERY     lasik     No current facility-administered medications for this encounter.   Current Outpatient Medications:  .  Prenatal Vit-Fe Fumarate-FA (PRENATAL MULTIVITAMIN) TABS tablet, Take 1 tablet by mouth daily at 12 noon., Disp: , Rfl:  .  amoxicillin-clavulanate (AUGMENTIN) 875-125 MG tablet, Take 1 tablet by mouth every 12 (twelve) hours., Disp: 20 tablet, Rfl: 0  Allergies Patient has no known allergies.  Family History  Problem Relation Age of Onset  . Diabetes Sister   . Birth defects Sister        clubbed feet  . Diabetes Maternal Uncle   . Diabetes Paternal Grandmother   . Cancer Paternal Grandmother        ovarian    Social History Social History   Tobacco Use  . Smoking status: Never Smoker  . Smokeless tobacco: Never Used  Substance Use Topics  . Alcohol use: No  . Drug use: No    Review of Systems Constitutional: As above.  ENT: No sore throat. Cardiovascular: Denies chest pain. Respiratory: As above.  Gastrointestinal: No abdominal pain.  Some nausea, no vomiting.  No diarrhea.   Genitourinary: Negative for dysuria. Musculoskeletal: Negative for back pain. Skin: Negative for rash. Neurological: Negative for headaches,  focal weakness or numbness. .  ____________________________________________   PHYSICAL EXAM:  VITAL SIGNS: ED Triage Vitals  Enc Vitals Group     BP 02/16/18 1549 (!) 108/57     Pulse Rate 02/16/18 1549 (!) 101     Resp 02/16/18 1549 18     Temp 02/16/18 1549 99.6 F (37.6 C)     Temp Source 02/16/18 1549 Oral     SpO2 02/16/18 1549 100 %     Weight 02/16/18 1548 230 lb  (104.3 kg)     Height 02/16/18 1548  (1.676 m)     Head Circumference --      Peak Flow --      Pain Score 02/16/18 1548 7     Pain Loc --      Pain Edu? --      Excl. in GC? --    Vitals:   02/16/18 1548 02/16/18 1549 02/16/18 1625  BP:  (!) 108/57   Pulse:  (!) 101 89  Resp:  18   Temp:  99.6 F (37.6 C)   TempSrc:  Oral   SpO2:  100% 99%  Weight: 230 lb (104.3 kg)    Height:  (1.676 m)       Constitutional: Alert and oriented. Well appearing and in no acute distress. Eyes: Conjunctivae are normal. PERRL. EOMI. Head: Atraumatic.Mild to moderate tenderness to palpation bilateral frontal and maxillary sinuses. No swelling. No erythema.   Ears: no erythema, normal TMs bilaterally.   Nose: nasal congestion with bilateral nasal turbinate erythema and edema.   Mouth/Throat: Mucous membranes are moist.  Oropharynx non-erythematous.No tonsillar swelling or exudate.  Neck: No stridor.  No cervical spine tenderness to palpation. Hematological/Lymphatic/Immunilogical: No cervical lymphadenopathy. Cardiovascular: Normal rate, regular rhythm. Grossly normal heart sounds.  Good peripheral circulation. Respiratory: Normal respiratory effort.  No retractions.No wheezes or rales .  Mild scattered rhonchi. Good air movement.  Gastrointestinal: Soft and nontender.  Musculoskeletal: No lower or upper extremity tenderness nor edema.  Bilateral pedal pulses equal and easily palpated. No cervical, thoracic or lumbar tenderness to palpation. Except: Right backside scapular mild tenderness to direct palpation, no swelling, no ecchymosis, no rash, pain also present with overhead reaching. Neurologic:  Normal speech and language. No gross focal neurologic deficits are appreciated. No gait instability. Skin:  Skin is warm, dry and intact. No rash noted. Psychiatric: Mood and affect are normal. Speech and behavior are normal.  ___________________________________________   LABS (all labs  ordered are listed, but only abnormal results are displayed)  Labs Reviewed - No data to display ____________________________________________   PROCEDURES Procedures   INITIAL IMPRESSION / ASSESSMENT AND PLAN / ED COURSE  Pertinent labs & imaging results that were available during my care of the patient were reviewed by me and considered in my medical decision making (see chart for details).  Well-appearing patient.  No acute distress.  Suspect recent viral upper respiratory infection with secondary sinusitis and continued cough.  Mild scattered rhonchi, lungs otherwise clear.  Patient with right posterior back pain that is fully reproducible on exam.  Patient reports intermittent shortness of breath.  Again suspect sinusitis with respiratory infection, however counseled regarding symptoms of pulmonary emboli.  Patient states that she feels like her shortness of breath symptoms are consistent with her normal pregnancy even early in pregnancy.  Patient declines d-dimer evaluation and declines any further evaluation at this time, states verbalized understanding risk.  Encourage close PCP and OB/GYN follow-up.  Discussed her  follow-up and return parameters.  Encourage rest, fluids, supportive care and over-the-counter Robitussin as needed.Discussed indication, risks and benefits of medications with patient.  Discussed follow up with Primary care physician this week. Discussed follow up and return parameters including no resolution or any worsening concerns. Patient verbalized understanding and agreed to plan.   ____________________________________________   FINAL CLINICAL IMPRESSION(S) / ED DIAGNOSES  Final diagnoses:  Acute maxillary sinusitis, recurrence not specified  Cough     ED Discharge Orders        Ordered    amoxicillin-clavulanate (AUGMENTIN) 875-125 MG tablet  Every 12 hours     02/16/18 1622       Note: This dictation was prepared with Dragon dictation along with  smaller phrase technology. Any transcriptional errors that result from this process are unintentional.         Renford Dills, NP 02/16/18 1709

## 2018-03-01 ENCOUNTER — Ambulatory Visit
Admission: EM | Admit: 2018-03-01 | Discharge: 2018-03-01 | Disposition: A | Discharge status: Home / Self Care | Payer: Self-pay | Attending: Family Medicine | Admitting: Family Medicine

## 2018-03-01 ENCOUNTER — Encounter: Payer: Self-pay | Admitting: Emergency Medicine

## 2018-03-01 ENCOUNTER — Other Ambulatory Visit: Payer: Self-pay

## 2018-03-01 DIAGNOSIS — R21 Rash and other nonspecific skin eruption: Secondary | ICD-10-CM

## 2018-03-01 NOTE — Discharge Instructions (Signed)
Wash hands well.  OTC hydrocortisone if desired  Take care  Dr. Adriana Simasook

## 2018-03-01 NOTE — ED Provider Notes (Signed)
[facility]    CSN: [id number] Arrival date & time: 03/01/18  1237  History   Chief Complaint Chief Complaint  Patient presents with  . Rash    APPT   HPI  42 year old female presents with rash.  Patient has had recent pain on the right side of her back at the bra line.  She subsequently developed a rash.  Rash seems to be resolving and pain is improved.  However it continues to itch.  She is concerned about shingles especially given the fact that she is currently pregnant and has a small child at home.  No fever.  No chills.  No known exacerbating or relieving factors.  No other symptoms.  No other complaints.  Past Medical History:  Diagnosis Date  . Headache(784.0)   . Hx of varicella    Patient Active Problem List   Diagnosis Date Noted  . Labor, precipitous, delivered 05/23/2017  . Postpartum care following vaginal delivery 9/2 05/23/2017  . Second-degree perineal laceration, with delivery 05/23/2017   Past Surgical History:  Procedure Laterality Date  . EYE SURGERY     lasik   OB History    Gravida  4   Para  3   Term  3   Preterm  0   AB  0   Living  3     SAB  0   TAB  0   Ectopic  0   Multiple  0   Live Births  3          Home Medications    Prior to Admission medications   Medication Sig Start Date End Date Taking? Authorizing Provider  Prenatal Vit-Fe Fumarate-FA (PRENATAL MULTIVITAMIN) TABS tablet Take 1 tablet by mouth daily at 12 noon.   Yes [provider]    Family History Family History  Problem Relation Age of Onset  . Diabetes Sister   . Birth defects Sister        clubbed feet  . Diabetes Maternal Uncle   . Diabetes Paternal Grandmother   . Cancer Paternal Grandmother        ovarian  . Healthy Mother   . Healthy Father     Social History Social History   Tobacco Use  . Smoking status: Never Smoker  . Smokeless tobacco: Never Used  Substance Use Topics  . Alcohol use: No  . Drug use: No     Allergies   Patient has no known allergies.  Review of Systems Review of Systems  Musculoskeletal:       Back pain.  Skin: Positive for rash.   Physical Exam Triage Vital Signs ED Triage Vitals [03/01/18 1328]  Enc Vitals Group     BP 106/66     Pulse Rate 94     Resp 16     Temp 98.5 F (36.9 C)     Temp Source Oral     SpO2 100 %     Weight 230 lb (104.3 kg)     Height 5\' 6"  (1.676 m)     Head Circumference      Peak Flow      Pain Score 0     Pain Loc      Pain Edu?      Excl. in GC?    Updated Vital Signs BP 106/66 (BP Location: Left Arm)   Pulse 94   Temp 98.5 F (36.9 C) (Oral)   Resp 16   Ht 5\' 6"  (1.676 m)  Wt 230 lb (104.3 kg)   LMP 12/22/2017 (Approximate)   SpO2 100%   BMI 37.12 kg/m   Visual Acuity Right Eye Distance:   Left Eye Distance:   Bilateral Distance:    Right Eye Near:   Left Eye Near:    Bilateral Near:     Physical Exam  Constitutional: She is oriented to person, place, and time. She appears well-developed. No distress.  Cardiovascular: Normal rate and regular rhythm.  Pulmonary/Chest: Effort normal. No respiratory distress.  Neurological: She is alert and oriented to person, place, and time.  Skin:  Right back at bra line -patient has a few erythematous papules.  Psychiatric: She has a normal mood and affect. Her behavior is normal.  Nursing note and vitals reviewed.  UC Treatments / Results  Labs (all labs ordered are listed, but only abnormal results are displayed) Labs Reviewed - No data to display  EKG None  Radiology No results found.  Procedures Procedures (including critical care time)  Medications Ordered in UC Medications - No data to display  Initial Impression / Assessment and Plan / UC Course  I have reviewed the triage vital signs and the nursing notes.  Pertinent labs & imaging results that were available during my care of the patient were reviewed by me and considered in my medical decision  making (see chart for details).    42 year old female presents with rash.  Possibility of herpes zoster but I cannot tell based on her current rash/presentation.  We discussed empiric treatment versus supportive care.  Patient elected for supportive care as she does not want to take any additional medication if she does not have to.  Advised her to wash her hands well regarding potential transmission to her younger child.  Final Clinical Impressions(s) / UC Diagnoses   Final diagnoses:  Rash     Discharge Instructions     Wash hands well.  OTC hydrocortisone if desired  Take care  Dr. Adriana Simas    ED Prescriptions    None     Controlled Substance Prescriptions [place] Controlled Substance Registry consulted? Not Applicable   Tommie Sams, DO 03/01/18 1401

## 2018-03-01 NOTE — ED Triage Notes (Signed)
Patient in today c/o a rash on her back x 1 week. Patient states the rash is itching. Patient states she had severe back pain in the same area that the rash is in. Now pain has gone, but rash appeared. Patient is [redacted] weeks pregnant and has an 769 month old at home.

## 2018-06-28 ENCOUNTER — Other Ambulatory Visit (HOSPITAL_COMMUNITY)
Admission: RE | Admit: 2018-06-28 | Discharge: 2018-06-28 | Disposition: A | Discharge status: Home / Self Care | Payer: Medicaid Other | Source: Ambulatory Visit | Attending: Obstetrics and Gynecology | Admitting: Obstetrics and Gynecology

## 2018-06-28 ENCOUNTER — Encounter: Payer: Self-pay | Admitting: Obstetrics and Gynecology

## 2018-06-28 ENCOUNTER — Ambulatory Visit (INDEPENDENT_AMBULATORY_CARE_PROVIDER_SITE_OTHER): Payer: Medicaid Other | Admitting: Obstetrics and Gynecology

## 2018-06-28 DIAGNOSIS — O09522 Supervision of elderly multigravida, second trimester: Secondary | ICD-10-CM | POA: Diagnosis not present

## 2018-06-28 DIAGNOSIS — Z3A26 26 weeks gestation of pregnancy: Secondary | ICD-10-CM | POA: Insufficient documentation

## 2018-06-28 DIAGNOSIS — O99210 Obesity complicating pregnancy, unspecified trimester: Secondary | ICD-10-CM | POA: Insufficient documentation

## 2018-06-28 DIAGNOSIS — O0990 Supervision of high risk pregnancy, unspecified, unspecified trimester: Secondary | ICD-10-CM | POA: Insufficient documentation

## 2018-06-28 DIAGNOSIS — Z6841 Body Mass Index (BMI) 40.0 and over, adult: Secondary | ICD-10-CM | POA: Insufficient documentation

## 2018-06-28 DIAGNOSIS — Z23 Encounter for immunization: Secondary | ICD-10-CM

## 2018-06-28 DIAGNOSIS — O0993 Supervision of high risk pregnancy, unspecified, third trimester: Secondary | ICD-10-CM

## 2018-06-28 DIAGNOSIS — O99213 Obesity complicating pregnancy, third trimester: Secondary | ICD-10-CM

## 2018-06-28 DIAGNOSIS — O0933 Supervision of pregnancy with insufficient antenatal care, third trimester: Secondary | ICD-10-CM

## 2018-06-28 DIAGNOSIS — O09523 Supervision of elderly multigravida, third trimester: Secondary | ICD-10-CM | POA: Insufficient documentation

## 2018-06-28 DIAGNOSIS — O0930 Supervision of pregnancy with insufficient antenatal care, unspecified trimester: Secondary | ICD-10-CM | POA: Insufficient documentation

## 2018-06-28 NOTE — Progress Notes (Signed)
Influenza vaccine Tdap vaccine

## 2018-06-28 NOTE — Patient Instructions (Addendum)
Your due date is January 8th  Tdap Vaccine (Tetanus, Diphtheria and Pertussis): What You Need to Know 1. Why get vaccinated? Tetanus, diphtheria and pertussis are very serious diseases. Tdap vaccine can protect [place] from these diseases. And, Tdap vaccine given to pregnant women can protect newborn babies against pertussis. TETANUS (Lockjaw) is rare in the [place] today. It causes painful muscle tightening and stiffness, usually all over the body.  It can lead to tightening of muscles in the head and neck so you can't open your mouth, swallow, or sometimes even breathe. Tetanus kills about 1 out of 10 people who are infected even after receiving the best medical care.  DIPHTHERIA is also rare in the [place] today. It can cause a thick coating to form in the back of the throat.  It can lead to breathing problems, heart failure, paralysis, and death.  PERTUSSIS (Whooping Cough) causes severe coughing spells, which can cause difficulty breathing, vomiting and disturbed sleep.  It can also lead to weight loss, incontinence, and rib fractures. Up to 2 in 100 adolescents and 5 in 100 adults with pertussis are hospitalized or have complications, which could include pneumonia or death.  These diseases are caused by bacteria. Diphtheria and pertussis are spread from person to person through secretions from coughing or sneezing. Tetanus enters the body through cuts, scratches, or wounds. Before vaccines, as many as 200,000 cases of diphtheria, 200,000 cases of pertussis, and hundreds of cases of tetanus, were reported in the [place] each year. Since vaccination began, reports of cases for tetanus and diphtheria have dropped by about 99% and for pertussis by about 80%. 2. Tdap vaccine Tdap vaccine can protect adolescents and adults from tetanus, diphtheria, and pertussis. One dose of Tdap is routinely given at age 75 or 24. People who did not get Tdap at that age should get it as soon as  possible. Tdap is especially important for healthcare professionals and anyone having close contact with a baby younger than 12 months. Pregnant women should get a dose of Tdap during every pregnancy, to protect the newborn from pertussis. Infants are most at risk for severe, life-threatening complications from pertussis. Another vaccine, called Td, protects against tetanus and diphtheria, but not pertussis. A Td booster should be given every 10 years. Tdap may be given as one of these boosters if you have never gotten Tdap before. Tdap may also be given after a severe cut or burn to prevent tetanus infection. Your doctor or the person giving you the vaccine can give you more information. Tdap may safely be given at the same time as other vaccines. 3. Some people should not get this vaccine  A person who has ever had a life-threatening allergic reaction after a previous dose of any diphtheria, tetanus or pertussis containing vaccine, OR has a severe allergy to any part of this vaccine, should not get Tdap vaccine. Tell the person giving the vaccine about any severe allergies.  Anyone who had coma or long repeated seizures within 7 days after a childhood dose of DTP or DTaP, or a previous dose of Tdap, should not get Tdap, unless a cause other than the vaccine was found. They can still get Td.  Talk to your doctor if you: ? have seizures or another nervous system problem, ? had severe pain or swelling after any vaccine containing diphtheria, tetanus or pertussis, ? ever had a condition called Guillain-Barr Syndrome (GBS), ? aren't feeling well on the day the shot is  scheduled. 4. Risks With any medicine, including vaccines, there is a chance of side effects. These are usually mild and go away on their own. Serious reactions are also possible but are rare. Most people who get Tdap vaccine do not have any problems with it. Mild problems following Tdap: (Did not interfere with activities)  Pain  where the shot was given (about 3 in 4 adolescents or 2 in 3 adults)  Redness or swelling where the shot was given (about 1 person in 5)  Mild fever of at least 100.57F (up to about 1 in 25 adolescents or 1 in 100 adults)  Headache (about 3 or 4 people in 10)  Tiredness (about 1 person in 3 or 4)  Nausea, vomiting, diarrhea, stomach ache (up to 1 in 4 adolescents or 1 in 10 adults)  Chills, sore joints (about 1 person in 10)  Body aches (about 1 person in 3 or 4)  Rash, swollen glands (uncommon)  Moderate problems following Tdap: (Interfered with activities, but did not require medical attention)  Pain where the shot was given (up to 1 in 5 or 6)  Redness or swelling where the shot was given (up to about 1 in 16 adolescents or 1 in 12 adults)  Fever over 102F (about 1 in 100 adolescents or 1 in 250 adults)  Headache (about 1 in 7 adolescents or 1 in 10 adults)  Nausea, vomiting, diarrhea, stomach ache (up to 1 or 3 people in 100)  Swelling of the entire arm where the shot was given (up to about 1 in 500).  Severe problems following Tdap: (Unable to perform usual activities; required medical attention)  Swelling, severe pain, bleeding and redness in the arm where the shot was given (rare).  Problems that could happen after any vaccine:  People sometimes faint after a medical procedure, including vaccination. Sitting or lying down for about 15 minutes can help prevent fainting, and injuries caused by a fall. Tell your doctor if you feel dizzy, or have vision changes or ringing in the ears.  Some people get severe pain in the shoulder and have difficulty moving the arm where a shot was given. This happens very rarely.  Any medication can cause a severe allergic reaction. Such reactions from a vaccine are very rare, estimated at fewer than 1 in a million doses, and would happen within a few minutes to a few hours after the vaccination. As with any medicine, there is a very  remote chance of a vaccine causing a serious injury or death. The safety of vaccines is always being monitored. For more information, visit: http://www.aguilar.org/ 5. What if there is a serious problem? What should I look for? Look for anything that concerns you, such as signs of a severe allergic reaction, very high fever, or unusual behavior. Signs of a severe allergic reaction can include hives, swelling of the face and throat, difficulty breathing, a fast heartbeat, dizziness, and weakness. These would usually start a few minutes to a few hours after the vaccination. What should I do?  If you think it is a severe allergic reaction or other emergency that can't wait, call 9-1-1 or get the person to the nearest hospital. Otherwise, call your doctor.  Afterward, the reaction should be reported to the Vaccine Adverse Event Reporting System (VAERS). Your doctor might file this report, or you can do it yourself through the VAERS web site at www.vaers.SamedayNews.es, or by calling 775-173-9446. ? VAERS does not give medical advice. 6. The  National Vaccine Injury Compensation Program The National Vaccine Injury Compensation Program (VICP) is a federal program that was created to compensate people who may have been injured by certain vaccines. Persons who believe they may have been injured by a vaccine can learn about the program and about filing a claim by calling 1-(938)528-8054 or visiting the VICP website at SpiritualWord.at. There is a time limit to file a claim for compensation. 7. How can I learn more?  Ask your doctor. He or she can give you the vaccine package insert or suggest other sources of information.  Call your local or state health department.  Contact the Centers for Disease Control and Prevention (CDC): ? Call 508-451-2103 (1-800-CDC-INFO) or ? Visit CDC's website at PicCapture.uy CDC Tdap Vaccine VIS (11/14/13) This information is not intended to replace  advice given to you by your health care provider. Make sure you discuss any questions you have with your health care provider. Document Released: 03/08/2012 Document Revised: 05/28/2016 Document Reviewed: 05/28/2016 Elsevier Interactive Patient Education  2017 Elsevier Inc.  Influenza (Flu) Vaccine (Inactivated or Recombinant): What You Need to Know  1. Why get vaccinated? Influenza ("flu") is a contagious disease that spreads around the [place] every year, usually between October and May. Flu is caused by influenza viruses, and is spread mainly by coughing, sneezing, and close contact. Anyone can get flu. Flu strikes suddenly and can last several days. Symptoms vary by age, but can include:  fever/chills  sore throat  muscle aches  fatigue  cough  headache  runny or stuffy nose  Flu can also lead to pneumonia and blood infections, and cause diarrhea and seizures in children. If you have a medical condition, such as heart or lung disease, flu can make it worse. Flu is more dangerous for some people. Infants and young children, people 71 years of age and older, pregnant women, and people with certain health conditions or a weakened immune system are at greatest risk. Each year thousands of people in the [place] die from flu, and many more are hospitalized. Flu vaccine can:  keep you from getting flu,  make flu less severe if you do get it, and  keep you from spreading flu to your family and other people. 2. Inactivated and recombinant flu vaccines A dose of flu vaccine is recommended every flu season. Children 6 months through 60 years of age may need two doses during the same flu season. Everyone else needs only one dose each flu season. Some inactivated flu vaccines contain a very small amount of a mercury-based preservative called thimerosal. Studies have not shown thimerosal in vaccines to be harmful, but flu vaccines that do not contain thimerosal are  available. There is no live flu virus in flu shots. They cannot cause the flu. There are many flu viruses, and they are always changing. Each year a new flu vaccine is made to protect against three or four viruses that are likely to cause disease in the upcoming flu season. But even when the vaccine doesn't exactly match these viruses, it may still provide some protection. Flu vaccine cannot prevent:  flu that is caused by a virus not covered by the vaccine, or  illnesses that look like flu but are not.  It takes about 2 weeks for protection to develop after vaccination, and protection lasts through the flu season. 3. Some people should not get this vaccine Tell the person who is giving you the vaccine:  If you have any severe,  life-threatening allergies. If you ever had a life-threatening allergic reaction after a dose of flu vaccine, or have a severe allergy to any part of this vaccine, you may be advised not to get vaccinated. Most, but not all, types of flu vaccine contain a small amount of egg protein.  If you ever had Guillain-Barr Syndrome (also called GBS). Some people with a history of GBS should not get this vaccine. This should be discussed with your doctor.  If you are not feeling well. It is usually okay to get flu vaccine when you have a mild illness, but you might be asked to come back when you feel better.  4. Risks of a vaccine reaction With any medicine, including vaccines, there is a chance of reactions. These are usually mild and go away on their own, but serious reactions are also possible. Most people who get a flu shot do not have any problems with it. Minor problems following a flu shot include:  soreness, redness, or swelling where the shot was given  hoarseness  sore, red or itchy eyes  cough  fever  aches  headache  itching  fatigue  If these problems occur, they usually begin soon after the shot and last 1 or 2 days. More serious problems  following a flu shot can include the following:  There may be a small increased risk of Guillain-Barre Syndrome (GBS) after inactivated flu vaccine. This risk has been estimated at 1 or 2 additional cases per million people vaccinated. This is much lower than the risk of severe complications from flu, which can be prevented by flu vaccine.  Young children who get the flu shot along with pneumococcal vaccine (PCV13) and/or DTaP vaccine at the same time might be slightly more likely to have a seizure caused by fever. Ask your doctor for more information. Tell your doctor if a child who is getting flu vaccine has ever had a seizure.  Problems that could happen after any injected vaccine:  People sometimes faint after a medical procedure, including vaccination. Sitting or lying down for about 15 minutes can help prevent fainting, and injuries caused by a fall. Tell your doctor if you feel dizzy, or have vision changes or ringing in the ears.  Some people get severe pain in the shoulder and have difficulty moving the arm where a shot was given. This happens very rarely.  Any medication can cause a severe allergic reaction. Such reactions from a vaccine are very rare, estimated at about 1 in a million doses, and would happen within a few minutes to a few hours after the vaccination. As with any medicine, there is a very remote chance of a vaccine causing a serious injury or death. The safety of vaccines is always being monitored. For more information, visit: http://floyd.org/ 5. What if there is a serious reaction? What should I look for? Look for anything that concerns you, such as signs of a severe allergic reaction, very high fever, or unusual behavior. Signs of a severe allergic reaction can include hives, swelling of the face and throat, difficulty breathing, a fast heartbeat, dizziness, and weakness. These would start a few minutes to a few hours after the vaccination. What should I  do?  If you think it is a severe allergic reaction or other emergency that can't wait, call 9-1-1 and get the person to the nearest hospital. Otherwise, call your doctor.  Reactions should be reported to the Vaccine Adverse Event Reporting System (VAERS). Your doctor should file  this report, or you can do it yourself through the VAERS web site at www.vaers.SamedayNews.es, or by calling 4076153043. ? VAERS does not give medical advice. 6. The National Vaccine Injury Compensation Program The [employer] Vaccine Injury Compensation Program (VICP) is a federal program that was created to compensate people who may have been injured by certain vaccines. Persons who believe they may have been injured by a vaccine can learn about the program and about filing a claim by calling (807) 306-2535 or visiting the [place] website at GoldCloset.com.ee. There is a time limit to file a claim for compensation. 7. How can I learn more?  Ask your healthcare provider. He or she can give you the vaccine package insert or suggest other sources of information.  Call your local or state health department.  Contact the Centers for Disease Control and Prevention (CDC): ? Call 352-301-2673 (1-800-CDC-INFO) or ? Visit CDC's website at https://gibson.com/ Vaccine Information Statement, Inactivated Influenza Vaccine (04/27/2014) This information is not intended to replace advice given to you by your health care provider. Make sure you discuss any questions you have with your health care provider. Document Released: 07/02/2006 Document Revised: 05/28/2016 Document Reviewed: 05/28/2016 Elsevier Interactive Patient Education  2017 [employer].

## 2018-06-28 NOTE — Progress Notes (Signed)
New OB Note  06/28/2018   Clinic: [facility] for [facility]  Chief Complaint: NOB  Transfer of Care Patient: no  History of Present Illness: Ms. Impastato is a 42 y.o. [phone] @ 26/6 weeks (EDC 1/8, based on Patient's last menstrual period was 12/22/2017 (approximate).=9wk u/s at [facility]).  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   No preterm labor s/s. Only formal u/s done at [facility] for chest pain evaluation.   ROS: A 12-point review of systems was performed and negative, except as stated in the above HPI.  OBGYN History: As per HPI. OB History  Gravida Para Term Preterm AB Living  4 3 3  0 0 3  SAB TAB Ectopic Multiple Live Births  0 0 0 0 3    # Outcome Date GA Lbr Len/2nd Weight Sex Delivery Anes PTL Lv  4 Current           3 Term 05/23/17 [redacted]w[redacted]d / 00:03 7 lb 8.3 oz (3.41 kg) F Vag-Spont None  LIV  2 Term 06/02/13 [redacted]w[redacted]d 07:57 / 00:41 8 lb 1.3 oz (3.666 kg) F Vag-Spont EPI  LIV     Birth Comments: L club foot  1 Term 2011 [redacted]w[redacted]d 11:00 8 lb 5 oz (3.771 kg) M Vag-Spont EPI  LIV    Any issues with any prior pregnancies: no Prior children are healthy, doing well, and without any problems or issues: 2nd child born with a clubfoot History of pap smears: Yes. Last pap smear: patient unsure   Past Medical History: Past Medical History:  Diagnosis Date  . Headache(784.0)   . Hx of varicella     Past Surgical History: Past Surgical History:  Procedure Laterality Date  . EYE SURGERY     lasik    Family History:  Family History  Problem Relation Age of Onset  . Diabetes Sister   . Birth defects Sister        clubbed feet  . Diabetes Maternal Uncle   . Diabetes Paternal Grandmother   . Cancer Paternal Grandmother        ovarian  . Healthy Mother    . Healthy Father     Social History:  Social History   Socioeconomic History  . Marital status: Married    Spouse name: Mikki Santee  . Number of children: 3  . Years of education: 35  . Highest education level: Master's degree (e.g., MA, MS, MEng, MEd, MSW, MBA)  Occupational History  . Not on file  Social Needs  . Financial resource strain: Not hard at all  . Food insecurity:    Worry: Never true    Inability: Never true  . Transportation needs:    Medical: No    Non-medical: No  Tobacco Use  . Smoking status: Never Smoker  . Smokeless tobacco: Never Used  Substance and Sexual Activity  . Alcohol use: No  . Drug use: No  . Sexual activity: Yes  Lifestyle  . Physical activity:    Days per week: 3 days    Minutes per session: 20 min  . Stress: Not at all  Relationships  . Social connections:    Talks on phone: More than three times a week    Gets together: Once a week    Attends religious service: 1 to 4 times per  year    Active member of club or organization: Yes    Attends meetings of clubs or organizations: More than 4 times per year    Relationship status: Married  . Intimate partner violence:    Fear of current or ex partner: No    Emotionally abused: No    Physically abused: No    Forced sexual activity: No  Other Topics Concern  . Not on file  Social History Narrative  . Not on file    Allergy: No Known Allergies   Current Outpatient Medications: PNV  Physical Exam:   BP 108/74   Pulse 98   Wt 258 lb 6.4 oz (117.2 kg)   LMP 12/22/2017 (Approximate)   BMI 41.71 kg/m  Body mass index is 41.71 kg/m. Contractions: Not present Vag. Bleeding: None. Fundal height: 28 FHTs: 160s  General appearance: Well nourished, well developed female in no acute distress.  Neck:  Supple, normal appearance, and no thyromegaly  Cardiovascular: S1, S2 normal, no murmur, rub or gallop, regular rate and rhythm Respiratory:  Clear to auscultation bilateral.  Normal respiratory effort Abdomen: positive bowel sounds and no masses, hernias; diffusely non tender to palpation, non distended Breasts: breasts appear normal, no suspicious masses, no skin or nipple changes or axillary nodes, normal palpation. Neuro/Psych:  Normal mood and affect.  Skin:  Warm and dry.  Lymphatic:  No inguinal lymphadenopathy.   Pelvic exam: is not limited by body habitus EGBUS: within normal limits, Vagina: within normal limits and with no blood in the vault, Cervix: normal appearing cervix without discharge or lesions, closed/long/high, Uterus:  enlarged, c/w 26 week size, and Adnexa:  normal adnexa and no mass, fullness, tenderness  Laboratory: none  Imaging:  Per care everywhere  Assessment: pt stable  Plan: 1. Advanced maternal age in multigravida, third trimester Unsure about cffdna. Ask at next visit.  - Culture, OB Urine - Cytology - PAP - [place] MFM OB DETAIL +14 WK; Future - Tdap vaccine greater than or equal to 7yo IM - CBC; Future - HIV antibody (with reflex); Future - RPR; Future - Glucose Tolerance, 2 Hours w/1 Hour; Future - CMP and Liver; Future - Cystic Fibrosis Mutation 97; Future - Hemoglobinopathy evaluation; Future - Obstetric Panel, Including HIV; Future - SMN1 Copy Number Analysis; Future - Protein / creatinine ratio, urine; Future - TSH; Future  2. Supervision of other normal pregnancy, antepartum Routine care. Pt having issues with medicaid and may not get labs today. Will schedule anatomy u/s. Sign btl papers when medicaid is approved  3. Need for immunization against influenza - Flu Vaccine QUAD 36+ mos IM  Problem list reviewed and updated.  Follow up in 2 weeks.  The nature of [facility] with multiple MDs and other Advanced Practice Providers was explained to patient; also emphasized that residents, students are part of our team.  >50% of 25 min visit spent on counseling and coordination  of care.     Cornelia Copa MD Attending Center for [facility])

## 2018-06-29 ENCOUNTER — Encounter: Payer: Self-pay | Admitting: Radiology

## 2018-06-29 LAB — CYTOLOGY - PAP
CHLAMYDIA, DNA PROBE: NEGATIVE
DIAGNOSIS: NEGATIVE
HPV: NOT DETECTED
NEISSERIA GONORRHEA: NEGATIVE
TRICH (WINDOWPATH): NEGATIVE

## 2018-07-05 ENCOUNTER — Encounter (HOSPITAL_COMMUNITY): Payer: Self-pay

## 2018-07-05 ENCOUNTER — Other Ambulatory Visit (HOSPITAL_COMMUNITY): Payer: Self-pay | Admitting: *Deleted

## 2018-07-05 ENCOUNTER — Ambulatory Visit (HOSPITAL_COMMUNITY)
Admission: RE | Admit: 2018-07-05 | Discharge: 2018-07-05 | Disposition: A | Discharge status: Home / Self Care | Payer: Medicaid Other | Source: Ambulatory Visit | Attending: Obstetrics and Gynecology | Admitting: Obstetrics and Gynecology

## 2018-07-05 DIAGNOSIS — O09529 Supervision of elderly multigravida, unspecified trimester: Secondary | ICD-10-CM

## 2018-07-05 DIAGNOSIS — Z363 Encounter for antenatal screening for malformations: Secondary | ICD-10-CM | POA: Diagnosis not present

## 2018-07-05 DIAGNOSIS — O0932 Supervision of pregnancy with insufficient antenatal care, second trimester: Secondary | ICD-10-CM | POA: Insufficient documentation

## 2018-07-05 DIAGNOSIS — O09512 Supervision of elderly primigravida, second trimester: Secondary | ICD-10-CM | POA: Diagnosis not present

## 2018-07-05 DIAGNOSIS — O09523 Supervision of elderly multigravida, third trimester: Secondary | ICD-10-CM

## 2018-07-05 DIAGNOSIS — Z3A29 29 weeks gestation of pregnancy: Secondary | ICD-10-CM

## 2018-07-07 ENCOUNTER — Encounter: Payer: Self-pay | Admitting: Obstetrics and Gynecology

## 2018-07-07 NOTE — Progress Notes (Signed)
MFM changed edc to 12/25 from 1/1.

## 2018-07-12 ENCOUNTER — Ambulatory Visit (INDEPENDENT_AMBULATORY_CARE_PROVIDER_SITE_OTHER): Payer: Medicaid Other | Admitting: Obstetrics & Gynecology

## 2018-07-12 ENCOUNTER — Encounter: Payer: Self-pay | Admitting: *Deleted

## 2018-07-12 ENCOUNTER — Encounter: Payer: Self-pay | Admitting: Obstetrics & Gynecology

## 2018-07-12 VITALS — BP 105/70 | HR 92 | Wt 263.0 lb

## 2018-07-12 DIAGNOSIS — O09523 Supervision of elderly multigravida, third trimester: Secondary | ICD-10-CM

## 2018-07-12 DIAGNOSIS — O0990 Supervision of high risk pregnancy, unspecified, unspecified trimester: Secondary | ICD-10-CM

## 2018-07-12 NOTE — Progress Notes (Signed)
PRENATAL VISIT NOTE  Subjective:  Miranda Herrera is a 42 y.o. G4P3003 at [redacted]w[redacted]d being seen today for ongoing prenatal care.  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  Patient reports no complaints.  Contractions: Not present. Vag. Bleeding: None.  Movement: Present. Denies leaking of fluid.   The following portions of the patient's history were reviewed and updated as appropriate: allergies, current medications, past family history, past medical history, past social history, past surgical history and problem list. Problem list updated.  Objective:   Vitals:   07/12/18 1324  BP: 105/70  Pulse: 92  Weight: 263 lb (119.3 kg)    Fetal Status: Fetal Heart Rate (bpm): 131   Movement: Present     General:  Alert, oriented and cooperative. Patient is in no acute distress.  Skin: Skin is warm and dry. No rash noted.   Cardiovascular: Normal heart rate noted  Respiratory: Normal respiratory effort, no problems with respiration noted  Abdomen: Soft, gravid, appropriate for gestational age.  Pain/Pressure: Absent     Pelvic: Cervical exam deferred        Extremities: Normal range of motion.  Edema: Trace  Mental Status: Normal mood and affect. Normal behavior. Normal judgment and thought content.   [place] Mfm Ob Detail +14 Wk  Result Date: 07/05/2018 ----------------------------------------------------------------------  OBSTETRICS REPORT                       (Signed Final 07/05/2018 04:27 pm) ---------------------------------------------------------------------- Patient Info  ID #:       [id number]                          D.O.B.:  26-Sep-1975 (41 yrs)  Name:       Miranda Herrera                     Visit Date: 07/05/2018 03:31 pm ---------------------------------------------------------------------- Performed By  Performed By:     Marcellina Millin          Ref. Address:     [address]                                                             [place], [place]  [address]  Attending:        Noralee Space MD        Location:         [facility]  Referred By:      [place] Bing MD ---------------------------------------------------------------------- Orders   #  Description                          Code         Ordered By   1  [place] MFM OB DETAIL +14 WK              76811.01     CHARLIE PICKENS  ----------------------------------------------------------------------   #  Order #                    Accession #                 Episode #   1  [id number]                  [id number]                  [id number]  ---------------------------------------------------------------------- Indications   Advanced maternal age primigravida 36+,        O40.512   second trimester   [redacted] weeks gestation of pregnancy                Z3A.29   Encounter for antenatal screening for          Z36.3   malformations   Late prenatal care, second trimester           O09.32   Personal history of congenital abnormality     Z87.798   (sister bilateral clubfeet, daughter had   unilateral clubfoot)  ---------------------------------------------------------------------- Fetal Evaluation  Num Of Fetuses:         1  Fetal Heart Rate(bpm):  129  Cardiac Activity:       Observed  Presentation:           Cephalic  Placenta:               Posterior  P. Cord Insertion:      Visualized, central  Amniotic Fluid  AFI FV:      Within normal limits                              Largest Pocket(cm)                              6.6  ---------------------------------------------------------------------- Biometry  BPD:      76.3  mm     G. Age:  30w 4d         62  %    CI:        74.35   %    70 - 86                                                          FL/HC:      19.0   %  19.2 - 21.4  HC:      280.9  mm     G. Age:  30w 6d         42  %    HC/AC:      1.11        0.99 - 1.21  AC:      253.8  mm     G. Age:  29w 4d         36  %    FL/BPD:     70.1   %    71 - 87  FL:       53.5  mm     G. Age:  28w 3d          7  %    FL/AC:      21.1   %    20 - 24  HUM:      53.2  mm     G. Age:  31w 0d         72  %  Est. FW:    1382  gm      3 lb 1 oz     43  % ---------------------------------------------------------------------- OB History  Gravidity:    4         Term:   3        Prem:   0        SAB:   0  TOP:          0       Ectopic:  0        Living: 3 ---------------------------------------------------------------------- Gestational Age  LMP:           27w 6d        Date:  12/22/17                 EDD:   09/28/18  U/S Today:     29w 6d                                        EDD:   09/14/18  Best:          29w 6d     Det. By:  U/S (07/05/18)           EDD:   09/14/18 ---------------------------------------------------------------------- Anatomy  Cranium:               Appears normal         Aortic Arch:            Appears normal  Cavum:                 Not well visualized    Ductal Arch:            Not well visualized  Ventricles:            Appears normal         Diaphragm:              Appears normal  Choroid Plexus:        Appears normal         Stomach:                Appears normal, left  sided  Cerebellum:            Appears normal         Abdomen:                Appears normal  Posterior Fossa:       Appears normal         Abdominal Wall:         Not well visualized  Nuchal Fold:           Not applicable (>20    Cord Vessels:           Appears normal ([redacted]                          wks GA)                                        vessel cord)  Face:                  Not well visualized    Kidneys:                Appear normal  Lips:                  Not well visualized    Bladder:                Appears normal  Thoracic:              Appears normal         Spine:                  Appears normal  Heart:                 Appears normal         Upper Extremities:      Appears normal                         (4CH, axis, and situs  RVOT:                  Appears normal         Lower Extremities:      Appears normal  LVOT:                  Appears normal  Other:  Fetus appears to be a female. Heels visualized. ---------------------------------------------------------------------- Cervix Uterus Adnexa  Cervix  Not visualized (advanced GA >24wks) ---------------------------------------------------------------------- Impression  Patient is here for fetal anatomy scan. She had opted not to  have screening for fetal aneuploidies. Her previous child had  club feet (underwent surgical repair).  We performed a fetal anatomy scan. No obvious fetal  structural defects are seen. No evidence of club feet. Fetal  biometry is consistent with 29w 6d gestation. Amniotic fluid is  normal and good fetal activity is seen. Patient understands  the limitations of ultrasound in detecting fetal anomalies.  We have assigned the EDD at 09/14/2018. ---------------------------------------------------------------------- Recommendations  -An appointment was made for her to return in 4 weeks for  fetal growth assessment and complete anatomy if possible.  -Weekly antenatal testing from 36 weeks till delivery (AMA). ----------------------------------------------------------------------                  Noralee Space, MD Electronically  Signed Final Report   07/05/2018 04:27 pm ----------------------------------------------------------------------   Assessment and Plan:  Pregnancy: G4P3003 at [redacted]w[redacted]d  1. Advanced maternal age  in multigravida, third trimester Prenatal labs done today.  Follow up growth scan already scheduled on 08/01/18. Will need antenatal testing starting 36 weeks, BPP scheduled 08/17/18. - [place] MFM FETAL BPP WO NON STRESS; Future - Culture, OB Urine - Hemoglobin A1c - TSH - Protein / creatinine ratio, urine - SMN1 Copy Number Analysis - Obstetric Panel, Including HIV - Hemoglobinopathy evaluation - Cystic Fibrosis Mutation 97 - CMP and Liver  2. Supervision of high risk pregnancy, antepartum Discussed BTL in detail, all questions answered.  Medicaid papers signed today. Preterm labor symptoms and general obstetric precautions including but not limited to vaginal bleeding, contractions, leaking of fluid and fetal movement were reviewed in detail with the patient. Please refer to After Visit Summary for other counseling recommendations.  Return in about 2 weeks (around 07/26/2018) for OB Visit. Also needs lab appointment next week for 2 hr GTT (needs to be fasting).  Future Appointments  Date Time Provider Department Center  08/01/2018  9:15 AM [facility]-M[facility] [place] 4 WH-[facility]  08/17/2018  1:00 PM WH-MFC [place] 3 WH-M[facility]    Jaynie Collins, MD

## 2018-07-12 NOTE — Patient Instructions (Signed)
Return to clinic for any scheduled appointments or obstetric concerns, or go to MAU for evaluation  

## 2018-07-12 NOTE — Addendum Note (Signed)
Addended by: Jaynie Collins A on: 07/12/2018 01:26 PM   Modules accepted: Orders

## 2018-07-13 LAB — OBSTETRIC PANEL, INCLUDING HIV
ANTIBODY SCREEN: NEGATIVE
BASOS: 1 %
Basophils Absolute: 0 10*3/uL (ref 0.0–0.2)
EOS (ABSOLUTE): 0.1 10*3/uL (ref 0.0–0.4)
EOS: 1 %
HEMATOCRIT: 31.6 % — AB (ref 34.0–46.6)
HEP B S AG: NEGATIVE
HIV SCREEN 4TH GENERATION: NONREACTIVE
Hemoglobin: 10.8 g/dL — ABNORMAL LOW (ref 11.1–15.9)
Immature Grans (Abs): 0.1 10*3/uL (ref 0.0–0.1)
Immature Granulocytes: 1 %
LYMPHS ABS: 1.5 10*3/uL (ref 0.7–3.1)
Lymphs: 22 %
MCH: 32.5 pg (ref 26.6–33.0)
MCHC: 34.2 g/dL (ref 31.5–35.7)
MCV: 95 fL (ref 79–97)
MONOCYTES: 5 %
Monocytes Absolute: 0.3 10*3/uL (ref 0.1–0.9)
NEUTROS ABS: 4.6 10*3/uL (ref 1.4–7.0)
Neutrophils: 70 %
PLATELETS: 240 10*3/uL (ref 150–450)
RBC: 3.32 x10E6/uL — ABNORMAL LOW (ref 3.77–5.28)
RDW: 13.1 % (ref 12.3–15.4)
RH TYPE: POSITIVE
RPR: NONREACTIVE
RUBELLA: 5.98 {index} (ref >0.99)
WBC: 6.6 10*3/uL (ref 3.4–10.8)

## 2018-07-13 LAB — CMP AND LIVER
ALK PHOS: 116 IU/L (ref 39–117)
ALT: 9 IU/L (ref 0–32)
AST: 10 IU/L (ref 0–40)
Albumin: 3.2 g/dL — ABNORMAL LOW (ref 3.5–5.5)
BILIRUBIN, DIRECT: 0.07 mg/dL (ref 0.00–0.40)
BUN: 4 mg/dL — AB (ref 6–20)
CO2: 19 mmol/L — AB (ref 20–29)
Calcium: 8.7 mg/dL (ref 8.7–10.2)
Chloride: 104 mmol/L (ref 96–106)
Creatinine, Ser: 0.51 mg/dL — ABNORMAL LOW (ref 0.57–1.00)
GFR calc non Af Amer: 122 mL/min/{1.73_m2} (ref >59)
GFR, EST AFRICAN AMERICAN: 140 mL/min/{1.73_m2} (ref >59)
Glucose: 110 mg/dL — ABNORMAL HIGH (ref 65–99)
Potassium: 3.5 mmol/L (ref 3.5–5.2)
Sodium: 138 mmol/L (ref 134–144)
Total Protein: 5.4 g/dL — ABNORMAL LOW (ref 6.0–8.5)

## 2018-07-13 LAB — HEMOGLOBIN A1C
Est. average glucose Bld gHb Est-mCnc: 97 mg/dL
Hgb A1c MFr Bld: 5 % (ref 4.8–5.6)

## 2018-07-13 LAB — PROTEIN / CREATININE RATIO, URINE
Creatinine, Urine: 193.5 mg/dL
Protein, Ur: 27.2 mg/dL
Protein/Creat Ratio: 141 mg/g creat (ref 0–200)

## 2018-07-13 LAB — TSH: TSH: 1.07 u[IU]/mL (ref 0.450–4.500)

## 2018-07-14 LAB — CULTURE, OB URINE

## 2018-07-14 LAB — URINE CULTURE, OB REFLEX

## 2018-07-19 ENCOUNTER — Other Ambulatory Visit: Payer: Self-pay

## 2018-07-19 ENCOUNTER — Other Ambulatory Visit: Payer: Medicaid Other

## 2018-07-19 DIAGNOSIS — O09523 Supervision of elderly multigravida, third trimester: Secondary | ICD-10-CM

## 2018-07-19 DIAGNOSIS — O0993 Supervision of high risk pregnancy, unspecified, third trimester: Secondary | ICD-10-CM

## 2018-07-19 LAB — SMN1 COPY NUMBER ANALYSIS (SMA CARRIER SCREENING)

## 2018-07-19 LAB — HEMOGLOBINOPATHY EVALUATION
HEMOGLOBIN F QUANTITATION: 0 % (ref 0.0–2.0)
HGB C: 0 %
HGB S: 0 %
HGB VARIANT: 0 %
Hemoglobin A2 Quantitation: 2.5 % (ref 1.8–3.2)
Hgb A: 97.5 % (ref 96.4–98.8)

## 2018-07-19 LAB — CYSTIC FIBROSIS MUTATION 97: GENE DIS ANAL CARRIER INTERP BLD/T-IMP: NOT DETECTED

## 2018-07-20 LAB — HIV ANTIBODY (ROUTINE TESTING W REFLEX): HIV Screen 4th Generation wRfx: NONREACTIVE

## 2018-07-20 LAB — CBC
HEMATOCRIT: 29.8 % — AB (ref 34.0–46.6)
HEMOGLOBIN: 10.7 g/dL — AB (ref 11.1–15.9)
MCH: 33.8 pg — ABNORMAL HIGH (ref 26.6–33.0)
MCHC: 35.9 g/dL — ABNORMAL HIGH (ref 31.5–35.7)
MCV: 94 fL (ref 79–97)
Platelets: 221 10*3/uL (ref 150–450)
RBC: 3.17 x10E6/uL — ABNORMAL LOW (ref 3.77–5.28)
RDW: 13.1 % (ref 12.3–15.4)
WBC: 8.2 10*3/uL (ref 3.4–10.8)

## 2018-07-20 LAB — RPR: RPR Ser Ql: NONREACTIVE

## 2018-07-20 LAB — GLUCOSE TOLERANCE, 2 HOURS W/ 1HR
GLUCOSE, FASTING: 94 mg/dL — AB (ref 65–91)
Glucose, 1 hour: 163 mg/dL (ref 65–179)
Glucose, 2 hour: 112 mg/dL (ref 65–152)

## 2018-07-21 ENCOUNTER — Telehealth: Payer: Self-pay | Admitting: *Deleted

## 2018-07-21 ENCOUNTER — Encounter: Payer: Self-pay | Admitting: Obstetrics and Gynecology

## 2018-07-21 DIAGNOSIS — O24419 Gestational diabetes mellitus in pregnancy, unspecified control: Secondary | ICD-10-CM

## 2018-07-21 HISTORY — DX: Gestational diabetes mellitus in pregnancy, unspecified control: O24.419

## 2018-07-21 NOTE — Telephone Encounter (Signed)
Tried to call patient to inform her of her results, unable to leave message, will try her back later today.

## 2018-07-21 NOTE — Telephone Encounter (Signed)
-----   Message from [place] Bing, MD sent at 07/21/2018  9:16 AM EDT ----- Please let her know that she has gdm and set her up for teaching, supplies, etc. thanks

## 2018-07-21 NOTE — Telephone Encounter (Signed)
-----   Message from Charlie Pickens, MD sent at 07/21/2018  9:16 AM EDT ----- Please let her know that she has gdm and set her up for teaching, supplies, etc. thanks 

## 2018-07-24 ENCOUNTER — Other Ambulatory Visit: Payer: Self-pay

## 2018-07-24 ENCOUNTER — Inpatient Hospital Stay (HOSPITAL_COMMUNITY)
Admission: AD | Admit: 2018-07-24 | Discharge: 2018-07-24 | Disposition: A | Discharge status: Home / Self Care | Payer: Medicaid Other | Source: Ambulatory Visit | Attending: Obstetrics and Gynecology | Admitting: Obstetrics and Gynecology

## 2018-07-24 ENCOUNTER — Encounter (HOSPITAL_COMMUNITY): Payer: Self-pay

## 2018-07-24 DIAGNOSIS — O368190 Decreased fetal movements, unspecified trimester, not applicable or unspecified: Secondary | ICD-10-CM | POA: Diagnosis present

## 2018-07-24 DIAGNOSIS — Z3A32 32 weeks gestation of pregnancy: Secondary | ICD-10-CM | POA: Diagnosis not present

## 2018-07-24 DIAGNOSIS — Z3689 Encounter for other specified antenatal screening: Secondary | ICD-10-CM

## 2018-07-24 DIAGNOSIS — O368130 Decreased fetal movements, third trimester, not applicable or unspecified: Secondary | ICD-10-CM | POA: Diagnosis present

## 2018-07-24 LAB — URINALYSIS, ROUTINE W REFLEX MICROSCOPIC
Bilirubin Urine: NEGATIVE
Glucose, UA: NEGATIVE mg/dL
Hgb urine dipstick: NEGATIVE
KETONES UR: 5 mg/dL — AB
NITRITE: NEGATIVE
PH: 6 (ref 5.0–8.0)
Protein, ur: NEGATIVE mg/dL
SPECIFIC GRAVITY, URINE: 1.009 (ref 1.005–1.030)

## 2018-07-24 NOTE — MAU Provider Note (Signed)
History     CSN: [id number]  Arrival date and time: 07/24/18 0931   First Provider Initiated Contact with Patient 07/24/18 1040      Chief Complaint  Patient presents with  . Decreased Fetal Movement   HPI  Ms.  Carmela Piechowski is a 42 y.o. year old G1P3003 female at [redacted]w[redacted]d weeks gestation who presents to MAU reporting DFM since this AM, lower back pain since last night; rated 8/10. She denies VB or lOF. She hasn't taken anything for the pain.  Past Medical History:  Diagnosis Date  . Headache(784.0)   . Hx of varicella     Past Surgical History:  Procedure Laterality Date  . EYE SURGERY     lasik    Family History  Problem Relation Age of Onset  . Diabetes Sister   . Birth defects Sister        clubbed feet  . Diabetes Maternal Uncle   . Diabetes Paternal Grandmother   . Cancer Paternal Grandmother        ovarian  . Healthy Mother   . Healthy Father     Social History   Tobacco Use  . Smoking status: Never Smoker  . Smokeless tobacco: Never Used  Substance Use Topics  . Alcohol use: No  . Drug use: No    Allergies: No Known Allergies  Medications Prior to Admission  Medication Sig Dispense Refill Last Dose  . Prenatal Vit-Fe Fumarate-FA (PRENATAL MULTIVITAMIN) TABS tablet Take 1 tablet by mouth daily at 12 noon.   07/24/2018 at Unknown time    Review of Systems  Constitutional: Negative.   HENT: Negative.   Eyes: Negative.   Respiratory: Negative.   Cardiovascular: Negative.   Gastrointestinal: Negative.   Endocrine: Negative.   Genitourinary:       Lower back pain since 07/23/18 PM.  Musculoskeletal: Positive for back pain.  Skin: Negative.   Allergic/Immunologic: Negative.   Neurological: Negative.   Hematological: Negative.   Psychiatric/Behavioral: Negative.    Physical Exam   Blood pressure 118/64, temperature 98 F (36.7 C), temperature source Oral, resp. rate 20, height 5\' 6"  (1.676 m), weight 119.3 kg, last menstrual period  12/22/2017, SpO2 98 %.  Physical Exam  Nursing note and vitals reviewed. Constitutional: She is oriented to person, place, and time. She appears well-developed and well-nourished.  HENT:  Head: Normocephalic and atraumatic.  Eyes: Pupils are equal, round, and reactive to light.  Neck: Normal range of motion.  Cardiovascular: Normal rate.  Respiratory: Effort normal.  GI: Soft.  Genitourinary:  Genitourinary Comments: Dilation: Closed Effacement (%): 50 Cervical Position: Posterior Exam by:: Raelyn Mora CNM    Musculoskeletal: Normal range of motion.  Neurological: She is alert and oriented to person, place, and time. She has normal reflexes.  Skin: Skin is warm and dry.  Psychiatric: She has a normal mood and affect. Her behavior is normal. Judgment and thought content normal.    MAU Course  Procedures  MDM CCUA NST - FHR: 135 bpm / moderate variability / accels present / decels absent / TOCO: none  *Consult with Dr. Alysia Penna @ 1150 - notified of patient's complaints, assessments, lab & NST results, tx plan d/c home with [facility], keep scheduled appt - ok to d/c home, agrees with plan  Results for orders placed or performed during the hospital encounter of 07/24/18 (from the past 48 hour(s))  Urinalysis, Routine w reflex microscopic     Status: Abnormal   Collection Time: 07/24/18  9:59  AM  Result Value Ref Range   Color, Urine YELLOW YELLOW   APPearance CLEAR CLEAR   Specific Gravity, Urine 1.009 1.005 - 1.030   pH 6.0 5.0 - 8.0   Glucose, UA NEGATIVE NEGATIVE mg/dL   Hgb urine dipstick NEGATIVE NEGATIVE   Bilirubin Urine NEGATIVE NEGATIVE   Ketones, ur 5 (A) NEGATIVE mg/dL   Protein, ur NEGATIVE NEGATIVE mg/dL   Nitrite NEGATIVE NEGATIVE   Leukocytes, UA TRACE (A) NEGATIVE   WBC, UA 0-5 0 - 5 WBC/hpf   Bacteria, UA RARE (A) NONE SEEN   Squamous Epithelial / LPF 0-5 0 - 5   Mucus PRESENT     Comment: Performed at [facility], [address], [place], [place]  [address]    Assessment and Plan  Decreased fetal movements in third trimester, single or unspecified fetus  - Reassurance given of reactive FHR tracing - Information provided on [facility]  - Discharge home - Keep scheduled appt on 11/5/with [facility]  - Patient verbalized an understanding of the plan of care and agrees.   Raelyn Mora, MSN, CNM 07/24/2018, 10:40 AM

## 2018-07-24 NOTE — MAU Note (Signed)
Pt.  Reports decreased fetal movement since waking up this morning.  FHR 135 in room.  Pt reports lower back pain since last night that she rates 8/10. Reports pain comes and goes.  Pt. Denies vag bleeding or LOF.

## 2018-07-25 ENCOUNTER — Telehealth: Payer: Self-pay | Admitting: *Deleted

## 2018-07-25 NOTE — Progress Notes (Signed)
Several attempts made to talk patient in regards to her results, unable to leave message.   Scheryl Marten, RN

## 2018-07-25 NOTE — Telephone Encounter (Signed)
Attempted to call patient to discuss results, unable to leave message.

## 2018-07-26 ENCOUNTER — Ambulatory Visit (INDEPENDENT_AMBULATORY_CARE_PROVIDER_SITE_OTHER): Payer: Medicaid Other | Admitting: Obstetrics & Gynecology

## 2018-07-26 VITALS — BP 92/61 | HR 103 | Wt 264.0 lb

## 2018-07-26 DIAGNOSIS — O0990 Supervision of high risk pregnancy, unspecified, unspecified trimester: Secondary | ICD-10-CM

## 2018-07-26 DIAGNOSIS — O09523 Supervision of elderly multigravida, third trimester: Secondary | ICD-10-CM

## 2018-07-26 DIAGNOSIS — O24419 Gestational diabetes mellitus in pregnancy, unspecified control: Secondary | ICD-10-CM

## 2018-07-26 LAB — POCT URINALYSIS DIPSTICK OB: GLUCOSE, UA: NEGATIVE

## 2018-07-26 MED ORDER — ACCU-CHEK GUIDE W/DEVICE KIT: 1.0000 | PACK | Freq: Once | 0 refills | Status: AC

## 2018-07-26 MED ORDER — ACCU-CHEK FASTCLIX LANCETS MISC: 1.0000 | Freq: Four times a day (QID) | 12 refills | Status: AC

## 2018-07-26 MED ORDER — GLUCOSE BLOOD VI STRP: ORAL_STRIP | 12 refills | Status: DC

## 2018-07-26 NOTE — Addendum Note (Signed)
Addended by: Marya Landry D on: 07/26/2018 02:01 PM   Modules accepted: Orders

## 2018-07-26 NOTE — Patient Instructions (Addendum)
Return to clinic for any scheduled appointments or obstetric concerns, or go to [facility] for evaluation   Gestational Diabetes Mellitus, Diagnosis Gestational diabetes (gestational diabetes mellitus) is a temporary form of diabetes that some women develop during pregnancy. It usually occurs around weeks 24-28 of pregnancy and goes away after delivery. Hormonal changes during pregnancy can interfere with insulin production and function, which may result in one or both of these problems:  The pancreas does not make enough of a hormone called insulin.  Cells in the body do not respond properly to insulin that the body makes (insulin resistance).  Normally, insulin allows sugars (glucose) to enter cells in the body. The cells use glucose for energy. Insulin resistance or lack of insulin causes excess glucose to build up in the blood instead of going into cells. As a result, high blood glucose (hyperglycemia) develops. What are the risks? If gestational diabetes is treated, it is unlikely to cause problems. If it is not controlled with treatment, it may cause problems during labor and delivery, and some of those problems can be harmful to the unborn baby (fetus) and the mother. Uncontrolled gestational diabetes may also cause the newborn baby to have breathing problems and low blood glucose. Women who get gestational diabetes are more likely to develop it if they get pregnant again, and they are more likely to develop type 2 diabetes in the future. What increases the risk? This condition may be more likely to develop in pregnant women who:  Are older than age 89 during pregnancy.  Have a family history of diabetes.  Are overweight.  Had gestational diabetes in the past.  Have polycystic ovarian syndrome (PCOS).  Are pregnant with twins or multiples.  Are of American-Indian, African-American, Hispanic/Latino, or Asian/Pacific Islander descent.  What are the signs or symptoms? Most women do not  notice symptoms of gestational diabetes because the symptoms are similar to normal symptoms of pregnancy. Symptoms of gestational diabetes may include:  Increased thirst (polydipsia).  Increased hunger(polyphagia).  Increased urination (polyuria).  How is this diagnosed?  This condition may be diagnosed based on your blood glucose level, which may be checked with one or more of the following blood tests:  A fasting blood glucose (FBG) test. You will not be allowed to eat (you will fast) for at least 8 hours before a blood sample is taken.  A random blood glucose test. This checks your blood glucose at any time of day regardless of when you ate.  An oral glucose tolerance test (OGTT). This is usually done during weeks 24-28 of pregnancy. ? For this test, you will have an FBG test done. Then, you will drink a beverage that contains glucose. Your blood glucose will be tested again 1 hour after drinking the glucose beverage (1-hour OGTT). ? If the 1-hour OGTT result is at or above 140 mg/dL (7.8 mmol/L), you will repeat the OGTT. This time, your blood glucose will be tested 3 hours after drinking the glucose beverage (3-hour OGTT).  If you have risk factors, you may be screened for undiagnosed type 2 diabetes at your first health care visit during your pregnancy (prenatal visit). How is this treated?  Your treatment may be managed by a specialist called an endocrinologist. This condition is treated by following instructions from your health care provider about:  Eating a healthier diet and getting more physical activity. These changes are the most important ways to manage gestational diabetes.  Checking your blood glucose. Do this as often  as told.  Taking diabetes medicines or insulin every day. These will only be prescribed if they are needed. ? If you use insulin, you may need to adjust your dosage based on how physically active you are and what foods you eat. Your health care provider  will tell you how to do this.  Your health care provider will set treatment goals for you based on the stage of your pregnancy and any other medical conditions you have. Generally, the goal of treatment is to maintain the following blood glucose levels during pregnancy:  Fasting: at or below 95 mg/dL (5.3 mmol/L).  After meals (postprandial): ? One hour after a meal: at or below 140 mg/dL (7.8 mmol/L). ? Two hours after a meal: at or below 120 mg/dL (6.7 mmol/L).  A1c (hemoglobin A1c) level: 6-6.5%.  Follow these instructions at home:  Take over-the-counter and prescription medicines only as told by your health care provider.  Manage your weight gain during pregnancy. The amount of weight that you are expected to gain depends on your pre-pregnancy BMI (body mass index).  Keep all follow-up visits as told by your health care provider. This is important. Consider asking your health care provider these questions:   Do I need to meet with a diabetes educator?  Where can I find a support group for people with diabetes?  What equipment will I need to manage my diabetes at home?  What diabetes medicines do I need, and when should I take them?  How often do I need to check my blood glucose?  What number can I call if I have questions?  When is my next appointment? Where to find more information:  For more information about diabetes, visit: ? American Diabetes Association (ADA): www.diabetes.org ? American Association of Diabetes Educators (AADE): www.diabeteseducator.org/patient-resources Contact a health care provider if:  Your blood glucose level is at or above 240 mg/dL (13.3 mmol/L).  Your blood glucose level is at or above 200 mg/dL (11.1 mmol/L) and you have ketones in your urine.  You have been sick or have had a fever for 2 days or more and you are not getting better.  You have any of the following problems for more than 6 hours: ? You cannot eat or drink. ? You have  nausea and vomiting. ? You have diarrhea. Get help right away if:  Your blood glucose is below 54 mg/dL (3 mmol/L).  You become confused or you have trouble thinking clearly.  You have difficulty breathing.  You have moderate or large ketone levels in your urine.  Your baby is moving around less than usual.  You develop unusual discharge or bleeding from your vagina.  You start having contractions early (prematurely). Contractions may feel like a tightening in your lower abdomen. This information is not intended to replace advice given to you by your health care provider. Make sure you discuss any questions you have with your health care provider. Document Released: 12/14/2000 Document Revised: 02/13/2016 Document Reviewed: 10/11/2015 Elsevier Interactive Patient Education  2018 [employer].    Gestational Diabetes Mellitus, Self Care Caring for yourself after you have been diagnosed with gestational diabetes (gestational diabetes mellitus) means keeping your blood sugar (glucose) under control with a balance of:  Nutrition.  Exercise.  Lifestyle changes.  Medicines or insulin, if necessary.  Support from your team of health care providers and others.  The following information explains what you need to know to manage your gestational diabetes at home. What do I  need to do to manage my blood glucose?  Check your blood glucose every day during your pregnancy. Do this as often as told by your health care provider.  Contact your health care provider if your blood glucose is above your target for 2 tests in a row. Your health care provider will set individualized treatment goals for you. Generally, the goal of treatment is to maintain the following blood glucose levels during pregnancy:  After not eating for 8 hours (after fasting): at or below 95 mg/dL (5.3 mmol/L).  After meals (postprandial): ? One hour after a meal: at or below 140 mg/dL (7.8 mmol/L). ? Two hours  after a meal: at or below 120 mg/dL (6.7 mmol/L).  A1c (hemoglobin A1c) level: 6-6.5%.  What do I need to know about hyperglycemia and hypoglycemia? What is hyperglycemia? Hyperglycemia, also called high blood glucose, occurs when blood glucose is too high. Make sure you know the early signs of hyperglycemia, such as:  Increased thirst.  Hunger.  Feeling very tired.  Needing to urinate more often than usual.  Blurry vision.  What is hypoglycemia? Hypoglycemia, also called low blood glucose, occurswith a blood glucose level at or below 70 mg/dL (3.9 mmol/L). The risk for hypoglycemia increases during or after exercise, during sleep, during illness, and when skipping meals or not eating for a long time (fasting). It is important to know the symptoms of hypoglycemia and treat it right away. Always have a 15-gram rapid-acting carbohydrate snack with you to treat low blood glucose.Family members and close friends should also know the symptoms and should understand how to treat hypoglycemia, in case you are not able to treat yourself. What are the symptoms of hypoglycemia? Hypoglycemia symptoms can include:  Hunger.  Anxiety.  Sweating and feeling clammy.  Confusion.  Dizziness or feeling light-headed.  Sleepiness.  Nausea.  Increased heart rate.  Headache.  Blurry vision.  Seizure.  Nightmares.  Tingling or numbness around the mouth, lips, or tongue.  A change in speech.  Decreased ability to concentrate.  A change in coordination.  Restless sleep.  Tremors or shakes.  Fainting.  Irritability.  How do I treat hypoglycemia?  If you are alert and able to swallow safely, follow the 15:15 rule:  Take 15 grams of a rapid-acting carbohydrate. Rapid-acting options include: ? 1 tube of glucose gel. ? 3 glucose pills. ? 6-8 pieces of hard candy. ? 4 oz (120 mL) of fruit juice. ? 4 oz (120 mL) of regular (not diet) soda.  Check your blood glucose 15  minutes after you take the carbohydrate.  If the repeat blood glucose level is still at or below 70 mg/dL (3.9 mmol/L), take 15 grams of a carbohydrate again.  If your blood glucose level does not increase above 70 mg/dL (3.9 mmol/L) after 3 tries, seek emergency medical care.  After your blood glucose level returns to normal, eat a meal or a snack within 1 hour.  How do I treat severe hypoglycemia? Severe hypoglycemia is when your blood glucose level is at or below 54 mg/dL (3 mmol/L). Severe hypoglycemia is an emergency. Do not wait to see if the symptoms will go away. Get medical help right away. Call your local emergency services (911 in the [place] Do not drive yourself to the hospital. If you have severe hypoglycemia and you cannot eat or drink, you may need an injection of glucagon. A family member or close friend should learn how to check your blood glucose and how to  give you a glucagon injection. Ask your health care provider if you need to have an emergency glucagon injection kit available. Severe hypoglycemia may need to be treated in a hospital. The treatment may include getting glucose through an IV tube. You may also need treatment for the cause of your hypoglycemia. What else can I do to manage my gestational diabetes? Take your diabetes medicines as told  If your health care provider prescribed insulin or diabetes medicines, take them every day.  Do not run out of insulin or other diabetes medicines that you take. Plan ahead so you always have these available.  If you use insulin, adjust your dosage based on how physically active you are and what foods you eat. Your health care provider will tell you how to adjust your dosage. Make healthy food choices  The things that you eat and drink affect your blood glucose. Making good choices helps to control your diabetes and prevent other health problems. A healthy meal plan includes eating lean proteins, complex carbohydrates, fresh  fruits and vegetables, low-fat dairy products, and healthy fats. Make an appointment to see a diet and nutrition specialist (registered dietitian) to help you create an eating plan that is right for you. Make sure that you:  Follow instructions from your health care provider about eating or drinking restrictions.  Drink enough fluid to keep your urine clear or pale yellow.  Eat healthy snacks between nutritious meals.  Track the carbohydrates that you eat. Do this by reading food labels and learning the standard serving sizes of foods.  Follow your sick day plan whenever you cannot eat or drink as usual. Make this plan in advance with your health care provider.  Stay active   Do at least 30 minutes of physical activity a day, or as much physical activity as your health care provider recommends during your pregnancy. ? Doing 10 minutes of exercise 30 minutes after each meal may help to control postprandial blood glucose levels.  If you start a new exercise or activity, work with your health care provider to adjust your insulin, medicines, or food intake as needed. Make healthy lifestyle choices  Do not drink alcohol.  Do not use any tobacco products, such as cigarettes, chewing tobacco, and e-cigarettes. If you need help quitting, ask your health care provider.  Learn to manage stress. If you need help with this, ask your health care provider. Care for your body  Keep your immunizations up to date.  Brush your teeth and gums two times a day, and floss at least one time a day.  Visit your dentist at least once every 6 months.  Maintain a healthy weight during your pregnancy. General instructions   Take over-the-counter and prescription medicines only as told by your health care provider.  Talk with your health care provider about your risk for high blood pressure during pregnancy (preeclampsia or eclampsia).  Share your diabetes management plan with people in your workplace,  school, and household.  Check your urine for ketones during your pregnancy when you are ill and as told by your health care provider.  Carry a medical alert card or wear medical alert jewelry.  Ask your health care provider: ? Do I need to meet with a diabetes educator? ? Where can I find a support group for people with diabetes?  Keep all follow-up visits during your pregnancy (prenatal) and after delivery (postnatal) as told by your health care provider. This is important. Get the care that you  need after delivery  Have your blood glucose level checked 4-12 weeks after delivery. This is done with an oral glucose tolerance test (OGTT).  Get screened for diabetes at least every 3 years, or as often as told by your health care provider. Where to find more information: To learn more about gestational diabetes, visit:  American Diabetes Association (ADA): www.diabetes.org/diabetes-basics/gestational  Centers for Disease Control and Prevention (CDC): http://sanchez-watson.com/.pdf  This information is not intended to replace advice given to you by your health care provider. Make sure you discuss any questions you have with your health care provider. Document Released: 12/30/2015 Document Revised: 02/13/2016 Document Reviewed: 10/11/2015 Elsevier Interactive Patient Education  [employer].

## 2018-07-26 NOTE — Progress Notes (Signed)
   PRENATAL VISIT NOTE  Subjective:  Miranda Herrera is a 42 y.o. [phone] at 70w6dbeing seen today for ongoing prenatal care.  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  Patient reports no complaints.  Contractions: Not present. Vag. Bleeding: None.  Movement: Present. Denies leaking of fluid.   The following portions of the patient's history were reviewed and updated as appropriate: allergies, current medications, past family history, past medical history, past social history, past surgical history and problem list. Problem list updated.  Objective:   Vitals:   07/26/18 1316  BP: 92/61  Pulse: (!) 103  Weight: 264 lb (119.7 kg)    Fetal Status: Fetal Heart Rate (bpm): 150   Movement: Present     General:  Alert, oriented and cooperative. Patient is in no acute distress.  Skin: Skin is warm and dry. No rash noted.   Cardiovascular: Normal heart rate noted  Respiratory: Normal respiratory effort, no problems with respiration noted  Abdomen: Soft, gravid, appropriate for gestational age.  Pain/Pressure: Absent     Pelvic: Cervical exam deferred        Extremities: Normal range of motion.     Mental Status: Normal mood and affect. Normal behavior. Normal judgment and thought content.   Assessment and Plan:  Pregnancy: [id number] 317w6d1. Gestational diabetes mellitus (GDM) in third trimester, gestational diabetes method of control unspecified New diagnosis.  Will get education and supplies soon. Growth scan scheduled soon.  - Blood Glucose Monitoring Suppl (ACCU-CHEK GUIDE) w/Device KIT; 1 kit by Does not apply route once for 1  dose.  Dispense: 1 kit; Refill: 0 - glucose blood (ACCU-CHEK GUIDE) test strip; Use one test strip to check blood glucose 4 times daily.  Dispense: 100 each; Refill: 12 - ACCU-CHEK FASTCLIX LANCETS MISC; 1 each by Percutaneous route 4 (four) times daily.  Dispense: 100 each; Refill: 12 - Referral to Nutrition and Diabetes Services  2. Advanced maternal age in multigravida, third trimester Will start antenatal testing at [id number]  3. Supervision of high risk pregnancy, antepartum Preterm labor symptoms and general obstetric precautions including but not limited to vaginal bleeding, contractions, leaking of fluid and fetal movement were reviewed in detail with the patient. Please refer to After Visit Summary for other counseling recommendations.  Return in about 2 weeks (around 08/09/2018) for OB Visit.  Future Appointments  Date Time Provider [place]11/07/2018  9:15 AM [place] [facility]  08/17/2018  1:00 PM [facility] [place] [facility]    UgVerita SchneidersMD

## 2018-08-01 ENCOUNTER — Ambulatory Visit (HOSPITAL_COMMUNITY)
Admission: RE | Admit: 2018-08-01 | Discharge: 2018-08-01 | Disposition: A | Discharge status: Home / Self Care | Payer: Medicaid Other | Source: Ambulatory Visit | Attending: Obstetrics and Gynecology | Admitting: Obstetrics and Gynecology

## 2018-08-01 ENCOUNTER — Encounter (HOSPITAL_COMMUNITY): Payer: Self-pay

## 2018-08-01 ENCOUNTER — Other Ambulatory Visit (HOSPITAL_COMMUNITY): Payer: Self-pay | Admitting: *Deleted

## 2018-08-01 DIAGNOSIS — O09523 Supervision of elderly multigravida, third trimester: Secondary | ICD-10-CM

## 2018-08-01 DIAGNOSIS — O0933 Supervision of pregnancy with insufficient antenatal care, third trimester: Secondary | ICD-10-CM | POA: Diagnosis not present

## 2018-08-01 DIAGNOSIS — O99213 Obesity complicating pregnancy, third trimester: Secondary | ICD-10-CM

## 2018-08-01 DIAGNOSIS — O09529 Supervision of elderly multigravida, unspecified trimester: Secondary | ICD-10-CM

## 2018-08-01 DIAGNOSIS — Z3A33 33 weeks gestation of pregnancy: Secondary | ICD-10-CM | POA: Insufficient documentation

## 2018-08-09 ENCOUNTER — Ambulatory Visit (INDEPENDENT_AMBULATORY_CARE_PROVIDER_SITE_OTHER): Payer: Medicaid Other | Admitting: Family Medicine

## 2018-08-09 ENCOUNTER — Encounter: Payer: Self-pay | Admitting: Radiology

## 2018-08-09 VITALS — BP 110/68 | HR 97 | Wt 262.0 lb

## 2018-08-09 DIAGNOSIS — O24410 Gestational diabetes mellitus in pregnancy, diet controlled: Secondary | ICD-10-CM

## 2018-08-09 DIAGNOSIS — O0993 Supervision of high risk pregnancy, unspecified, third trimester: Secondary | ICD-10-CM

## 2018-08-09 DIAGNOSIS — O0990 Supervision of high risk pregnancy, unspecified, unspecified trimester: Secondary | ICD-10-CM

## 2018-08-09 DIAGNOSIS — O09523 Supervision of elderly multigravida, third trimester: Secondary | ICD-10-CM

## 2018-08-09 NOTE — Progress Notes (Signed)
   PRENATAL VISIT NOTE  Subjective:  Miranda Herrera is a 42 y.o. G4P3003 at 2576w6d being seen today for ongoing prenatal care.  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  Patient reports no complaints.  Contractions: Not present.  .  Movement: Present. Denies leaking of fluid.   The following portions of the patient's history were reviewed and updated as appropriate: allergies, current medications, past family history, past medical history, past social history, past surgical history and problem list. Problem list updated.  Objective:   Vitals:   08/09/18 1002  BP: 110/68  Pulse: 97  Weight: 262 lb (118.8 kg)    Fetal Status: Fetal Heart Rate (bpm): 154 Fundal Height: 33 cm Movement: Present     General:  Alert, oriented and cooperative. Patient is in no acute distress.  Skin: Skin is warm and dry. No rash noted.   Cardiovascular: Normal heart rate noted  Respiratory: Normal respiratory effort, no problems with respiration noted  Abdomen: Soft, gravid, appropriate for gestational age.  Pain/Pressure: Absent     Pelvic: Cervical exam deferred        Extremities: Normal range of motion.  Edema: None  Mental Status: Normal mood and affect. Normal behavior. Normal judgment and thought content.   Assessment and Plan:  Pregnancy: G4P3003 at 5076w6d  1. Diet controlled gestational diabetes mellitus (GDM) in third trimester No book today--notes some elevated fastings  Between 96-110. Postprandials are < 100 frequently. Advised 10 minutes walking post meals. Protein containing snack at hs - Referral to Nutrition and Diabetes  Services  2. Supervision of high risk pregnancy, antepartum EDC changed per 1st trimester u/s-->reviewed with MFM--u/s is in care everywhere.  3. Advanced maternal age in multigravida, third trimester Normal NIPT  Preterm labor symptoms and general obstetric precautions including but not limited to vaginal bleeding, contractions, leaking of fluid and fetal movement were reviewed in detail with the patient. Please refer to After Visit Summary for other counseling recommendations.  Return in 2 weeks (on 08/23/2018) for OB visit and NST.  Future Appointments  Date Time Provider Department Center  08/17/2018  1:00 PM [facility] US 3 [facility]  08/23/2018  9:30 AM Anyanwu, Jethro BastosUgonna A, MD [facility]  08/29/2018 10:30 AM [facility] US 1 [facility]    Reva Boresanya S , MD

## 2018-08-09 NOTE — Patient Instructions (Signed)

## 2018-08-11 ENCOUNTER — Encounter: Payer: Medicaid Other | Attending: Obstetrics & Gynecology | Admitting: *Deleted

## 2018-08-11 ENCOUNTER — Encounter: Payer: Self-pay | Admitting: *Deleted

## 2018-08-11 VITALS — BP 110/60 | Ht 66.0 in | Wt 260.7 lb

## 2018-08-11 DIAGNOSIS — Z3A00 Weeks of gestation of pregnancy not specified: Secondary | ICD-10-CM | POA: Insufficient documentation

## 2018-08-11 DIAGNOSIS — O24410 Gestational diabetes mellitus in pregnancy, diet controlled: Secondary | ICD-10-CM | POA: Insufficient documentation

## 2018-08-11 DIAGNOSIS — Z713 Dietary counseling and surveillance: Secondary | ICD-10-CM | POA: Diagnosis not present

## 2018-08-11 DIAGNOSIS — O09523 Supervision of elderly multigravida, third trimester: Secondary | ICD-10-CM | POA: Insufficient documentation

## 2018-08-11 NOTE — Patient Instructions (Signed)
Read booklet on Gestational Diabetes Follow Gestational Meal Planning Guidelines Don't skip meals Avoid sugar sweetened drinks Complete a 3 Day Food Record and bring to next appointment Check blood sugars 4 x day - before breakfast and 2 hrs after every meal and record  Bring blood sugar log to all appointments Purchase urine ketone strips if blood sugars not controlled and check urine ketones every am:  If + increase bedtime snack to 1 protein and 2 carbohydrate servings Walk 20-30 minutes at least 5 x week if permitted by MD

## 2018-08-12 NOTE — Progress Notes (Signed)
Diabetes Self-Management Education  Visit Type: First/Initial  Appt. Start Time: 1515 Appt. End Time: 6834  08/11/2018  [name], identified by name and date of birth, is a 42 y.o. female with a diagnosis of Diabetes: Gestational Diabetes.   ASSESSMENT  Blood pressure 110/60, height 5' 6"  (1.676 m), weight 260 lb 11.2 oz (118.3 kg), last menstrual period 12/22/2017, unknown if currently breastfeeding. Body mass index is 42.08 kg/m.  Diabetes Self-Management Education - 08/11/18 1636      Visit Information   Visit Type  First/Initial      Initial Visit   Diabetes Type  Gestational Diabetes    Are you currently following a meal plan?  No    Are you taking your medications as prescribed?  Yes    Date Diagnosed  2 weeks ago      Health Coping   How would you rate your overall health?  Excellent      Psychosocial Assessment   Patient Belief/Attitude about Diabetes  Motivated to manage diabetes   "fine"   Self-care barriers  None    Self-management support  Doctor's office;Family    Patient Concerns  Nutrition/Meal planning;Glycemic Control;Weight Control    Special Needs  None    Preferred Learning Style  Visual    Learning Readiness  Ready    How often do you need to have someone help you when you read instructions, pamphlets, or other written materials from your doctor or pharmacy?  1 - Never    What is the last grade level you completed in school?  "graduate"      Pre-Education Assessment   Patient understands the diabetes disease and treatment process.  Needs Instruction    Patient understands incorporating nutritional management into lifestyle.  Needs Instruction    Patient undertands incorporating physical activity into lifestyle.  Needs Review    Patient understands using medications safely.  Needs Instruction    Patient understands monitoring blood glucose, interpreting and using results  Needs Review    Patient understands prevention, detection, and  treatment of acute complications.  Needs Instruction    Patient understands prevention, detection, and treatment of chronic complications.  Needs Instruction    Patient understands how to develop strategies to address psychosocial issues.  Needs Instruction    Patient understands how to develop strategies to promote health/change behavior.  Needs Instruction      Complications   Last HgB A1C per patient/outside source  5 %   07/12/18   How often do you check your blood sugar?  3-4 times/day    Fasting Blood glucose range (mg/dL)  70-129   Pt reports FBG's 96-107 mg/dL.    Postprandial Blood glucose range (mg/dL)  70-129   Pt reports pp's 86-100 mg/dL.    Have you had a dilated eye exam in the past 12 months?  No    Have you had a dental exam in the past 12 months?  No    Are you checking your feet?  No      Dietary Intake   Breakfast  skips or has yogurt    Snack (morning)  nuts, fruit, trail mix    Lunch  pizza, hummus and veggies, fruit    Snack (afternoon)  same as morning snack    Dinner  chicken, mac-n-cheese, rice pasta, peas, beans, corn, brussel sprouts, broccoli, green beans, asparagus    Beverage(s)  water, black coffee, green tea, regular soft drinks      Exercise  Exercise Type  Light (walking / raking leaves)    How many days per week to you exercise?  3    How many minutes per day do you exercise?  30    Total minutes per week of exercise  90      Patient Education   Previous Diabetes Education  No    Disease state   Definition of diabetes, type 1 and 2, and the diagnosis of diabetes;Factors that contribute to the development of diabetes    Nutrition management   Role of diet in the treatment of diabetes and the relationship between the three main macronutrients and blood glucose level;Reviewed blood glucose goals for pre and post meals and how to evaluate the patients' food intake on their blood glucose level.    Physical activity and exercise   Role of exercise on  diabetes management, blood pressure control and cardiac health.    Monitoring  Purpose and frequency of SMBG.;Taught/discussed recording of test results and interpretation of SMBG.;Identified appropriate SMBG and/or A1C goals.;Ketone testing, when, how.    Chronic complications  Relationship between chronic complications and blood glucose control    Psychosocial adjustment  Identified and addressed patients feelings and concerns about diabetes    Preconception care  Pregnancy and GDM  Role of pre-pregnancy blood glucose control on the development of the fetus;Role of family planning for patients with diabetes;Reviewed with patient blood glucose goals with pregnancy      Individualized Goals (developed by patient)   Reducing Risk Improve blood sugars Lose weight     Outcomes   Expected Outcomes  Demonstrated interest in learning. Expect positive outcomes       Individualized Plan for Diabetes Self-Management Training:   Learning Objective:  Patient will have a greater understanding of diabetes self-management. Patient education plan is to attend individual and/or group sessions per assessed needs and concerns.   Plan:   Patient Instructions  Read booklet on Gestational Diabetes Follow Gestational Meal Planning Guidelines Don't skip meals Avoid sugar sweetened drinks Complete a 3 Day Food Record and bring to next appointment Check blood sugars 4 x day - before breakfast and 2 hrs after every meal and record  Bring blood sugar log to all appointments Purchase urine ketone strips if blood sugars not controlled and check urine ketones every am:  If + increase bedtime snack to 1 protein and 2 carbohydrate servings Walk 20-30 minutes at least 5 x week if permitted by MD  Expected Outcomes:  Demonstrated interest in learning. Expect positive outcomes  Education material provided:  Gestational Booklet Gestational Meal Planning Guidelines Simple Meal Plan Viewed Gestational Diabetes  Video 3 Day Food Record Goals for a Healthy Pregnancy  If problems or questions, patient to contact team via:  Johny Drilling, [place], [place], CDE [phone]  Future DSME appointment:  August 17, 2018 with the dietitian

## 2018-08-17 ENCOUNTER — Ambulatory Visit (HOSPITAL_COMMUNITY)
Admission: RE | Admit: 2018-08-17 | Discharge: 2018-08-17 | Disposition: A | Discharge status: Home / Self Care | Payer: Medicaid Other | Source: Ambulatory Visit | Attending: Obstetrics & Gynecology | Admitting: Obstetrics & Gynecology

## 2018-08-17 ENCOUNTER — Encounter: Payer: Self-pay | Admitting: Dietician

## 2018-08-17 ENCOUNTER — Encounter (HOSPITAL_COMMUNITY): Payer: Self-pay

## 2018-08-17 ENCOUNTER — Encounter: Payer: Medicaid Other | Admitting: Dietician

## 2018-08-17 VITALS — BP 104/68 | Ht 66.0 in | Wt 262.0 lb

## 2018-08-17 DIAGNOSIS — O09523 Supervision of elderly multigravida, third trimester: Secondary | ICD-10-CM | POA: Diagnosis not present

## 2018-08-17 DIAGNOSIS — Z3A36 36 weeks gestation of pregnancy: Secondary | ICD-10-CM | POA: Diagnosis not present

## 2018-08-17 DIAGNOSIS — Z713 Dietary counseling and surveillance: Secondary | ICD-10-CM | POA: Diagnosis not present

## 2018-08-17 DIAGNOSIS — O24410 Gestational diabetes mellitus in pregnancy, diet controlled: Secondary | ICD-10-CM

## 2018-08-17 DIAGNOSIS — O0933 Supervision of pregnancy with insufficient antenatal care, third trimester: Secondary | ICD-10-CM

## 2018-08-17 NOTE — Patient Instructions (Signed)
   Continue with healthy food choices, great job!  If you drink any soda, make sure to sip slowly and make it last as long as possible.

## 2018-08-17 NOTE — Progress Notes (Signed)
   Patient's BG record indicates fasting BGs slightly above goal, ranging 94-103; post-meal BGs within goal at 90-120.  Patient's food diary indicates healthy food choices and controlled carbohydrate intake. Patient does report drinking some regular soda occasionally but she states she is trying to sip slowly and limit the quantity.    Provided 1800kcal meal plan, and wrote individualized menus based on patient's food preferences.  Instructed patient on food safety, including avoidance of Listeriosis, and limiting mercury from fish.  Discussed importance of maintaining healthy lifestyle habits to reduce risk of Type 2 DM as well as Gestational DM with any future pregnancies.  Advised patient to use any remaining testing supplies to test some BGs after delivery, and to have BG tested ideally annually. Patient is planning tubal ligation after delivery.

## 2018-08-22 ENCOUNTER — Other Ambulatory Visit (HOSPITAL_COMMUNITY): Payer: Self-pay | Admitting: *Deleted

## 2018-08-22 DIAGNOSIS — O09529 Supervision of elderly multigravida, unspecified trimester: Secondary | ICD-10-CM

## 2018-08-23 ENCOUNTER — Ambulatory Visit (INDEPENDENT_AMBULATORY_CARE_PROVIDER_SITE_OTHER): Payer: Medicaid Other | Admitting: Obstetrics & Gynecology

## 2018-08-23 ENCOUNTER — Encounter (HOSPITAL_COMMUNITY): Payer: Self-pay

## 2018-08-23 ENCOUNTER — Other Ambulatory Visit (HOSPITAL_COMMUNITY): Payer: Self-pay | Admitting: Obstetrics and Gynecology

## 2018-08-23 ENCOUNTER — Ambulatory Visit (HOSPITAL_COMMUNITY)
Admission: RE | Admit: 2018-08-23 | Discharge: 2018-08-23 | Disposition: A | Discharge status: Home / Self Care | Payer: Medicaid Other | Source: Ambulatory Visit | Attending: Obstetrics & Gynecology | Admitting: Obstetrics & Gynecology

## 2018-08-23 VITALS — BP 102/51 | HR 97 | Wt 261.5 lb

## 2018-08-23 DIAGNOSIS — O09523 Supervision of elderly multigravida, third trimester: Secondary | ICD-10-CM

## 2018-08-23 DIAGNOSIS — Z8279 Family history of other congenital malformations, deformations and chromosomal abnormalities: Secondary | ICD-10-CM | POA: Insufficient documentation

## 2018-08-23 DIAGNOSIS — O0933 Supervision of pregnancy with insufficient antenatal care, third trimester: Secondary | ICD-10-CM | POA: Diagnosis not present

## 2018-08-23 DIAGNOSIS — O24419 Gestational diabetes mellitus in pregnancy, unspecified control: Secondary | ICD-10-CM

## 2018-08-23 DIAGNOSIS — Z3A36 36 weeks gestation of pregnancy: Secondary | ICD-10-CM | POA: Insufficient documentation

## 2018-08-23 DIAGNOSIS — O09529 Supervision of elderly multigravida, unspecified trimester: Secondary | ICD-10-CM

## 2018-08-23 DIAGNOSIS — O0990 Supervision of high risk pregnancy, unspecified, unspecified trimester: Secondary | ICD-10-CM

## 2018-08-23 MED ORDER — GLYBURIDE 2.5 MG PO TABS: 1.2500 mg | ORAL_TABLET | Freq: Every day | ORAL | 1 refills | Status: DC

## 2018-08-23 NOTE — Patient Instructions (Signed)
Return to clinic for any scheduled appointments or obstetric concerns, or go to MAU for evaluation  

## 2018-08-23 NOTE — Progress Notes (Signed)
PRENATAL VISIT NOTE  Subjective:  Miranda Herrera is a 42 y.o. G4P3003 at [redacted]w[redacted]d being seen today for ongoing prenatal care.  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  Patient reports no complaints.  Contractions: Not present.  .  Movement: Present. Denies leaking of fluid.   The following portions of the patient's history were reviewed and updated as appropriate: allergies, current medications, past family history, past medical history, past social history, past surgical history and problem list. Problem list updated.  Objective:   Vitals:   08/23/18 0938  BP: (!) 102/51  Pulse: 97  Weight: 261 lb 8 oz (118.6 kg)    Fetal Status: Fetal Heart Rate (bpm): 154 NST    Movement: Present     General:  Alert, oriented and cooperative. Patient is in no acute distress.  Skin: Skin is warm and dry. No rash noted.   Cardiovascular: Normal heart rate noted  Respiratory: Normal respiratory effort, no problems with respiration noted  Abdomen: Soft, gravid, appropriate for gestational age.  Pain/Pressure: Absent     Pelvic: Cervical exam deferred        Extremities: Normal range of motion.  Edema: None  Mental Status: Normal mood and affect. Normal behavior. Normal judgment and thought content.   Imaging: [place] Mfm Fetal Bpp Wo Non Stress  Result Date: 08/18/2018 ----------------------------------------------------------------------  OBSTETRICS REPORT                        (Signed Final 08/18/2018 10:24 am) ---------------------------------------------------------------------- Patient Info  ID #:       [id number]                           D.O.B.:  September 06, 1976 (42 yrs)  Name:       Ashok Cordia Falin                    Visit Date: 08/17/2018 01:29 pm ---------------------------------------------------------------------- Performed By  Performed By:     Vivien Rota        Ref. Address:      [address]                                                              [place], [place]  [id number]  Attending:        Lin Landsmanorenthian Booker      [facility]                    MD  Referred By:      [place] BingHARLIE                    PICKENS MD ---------------------------------------------------------------------- Orders   #  Description                          Code         Ordered By   1  US MFM FETAL BPP WO NON              [id number]     Jaynie CollinsUGONNA       STRESS  ----------------------------------------------------------------------   #  Order #                    Accession #                 Episode #   1  [id number]                  [id number]                  [id number]  ---------------------------------------------------------------------- Indications   Late to prenatal care, third trimester         O09.33   Personal history of congenital abnormality     Z87.798   (sister bilateral clubfeet, daughter had   unilateral clubfoot)   Advanced maternal age multigravida 7735+,        3O09.523   third trimester   [redacted] weeks gestation of pregnancy                Z3A.36  ---------------------------------------------------------------------- Vital Signs                                                 Height:        5'6" ---------------------------------------------------------------------- Fetal Evaluation  Num Of Fetuses:          1  Fetal Heart Rate(bpm):   142  Cardiac Activity:        Observed  Presentation:            Cephalic  Amniotic Fluid  AFI FV:      Within normal limits  AFI Sum(cm)     %Tile       Largest  Pocket(cm)  18.95           71          5.74  RUQ(cm)       RLQ(cm)       LUQ(cm)        LLQ(cm)  5.74          5.01          3.42           4.78 ---------------------------------------------------------------------- Biophysical Evaluation  Amniotic F.V:   Pocket => 2 cm two         F. Tone:         Observed  planes  F. Movement:    Observed                   Score:           8/8  F. Breathing:   Observed ---------------------------------------------------------------------- OB History  Gravidity:    4         Term:   3        Prem:   0        SAB:   0  TOP:          0       Ectopic:  0        Living: 3 ---------------------------------------------------------------------- Gestational Age  LMP:           34w 0d        Date:  12/22/17                 EDD:   09/28/18  Best:          Stevie Kern 0d     Det. By:  U/S  (07/05/18)          EDD:   09/14/18 ---------------------------------------------------------------------- Anatomy  Stomach:               Appears normal, left   Bladder:                Appears normal                         sided  Kidneys:               Appear normal  Other:  Female gender. ---------------------------------------------------------------------- Impression  Biophysical profile 8/8 ---------------------------------------------------------------------- Recommendations  Follow up in 1 weeks  Growth scheduled on 12/9 ----------------------------------------------------------------------               Lin Landsman, MD Electronically Signed Final Report   08/18/2018 10:24 am ----------------------------------------------------------------------  [place] Mfm Ob Follow Up  Result Date: 08/01/2018 ----------------------------------------------------------------------  OBSTETRICS REPORT                       (Signed Final 08/01/2018 02:03 pm) ---------------------------------------------------------------------- Patient Info  ID #:       [id number]                          D.O.B.:  September 15, 1976  (42 yrs)  Name:       Ashok Cordia Delia                    Visit Date: 08/01/2018 09:34 am ---------------------------------------------------------------------- Performed By  Performed By:     Hurman Horn          Ref. Address:      [address]  [place], [place]                                                              [address]  Attending:        Lin Landsman      Location:          [facility]                    MD  Referred By:      [place] Bing MD ---------------------------------------------------------------------- Orders   #  Description                          Code         Ordered By   1  [place] MFM OB FOLLOW UP                  32951.88     Noralee Space  ----------------------------------------------------------------------   #  Order #                    Accession #                 Episode #   1  [id number]                  [id number]                  [id number]  ---------------------------------------------------------------------- Indications   Late to prenatal care, third trimester         O09.33   Personal history of congenital abnormality     Z87.798   (sister bilateral clubfeet, daughter had   unilateral clubfoot)   Advanced maternal age multigravida 94+,        O28.523   third trimester   Obesity complicating pregnancy, third          O99.213   trimester   [redacted] weeks gestation of pregnancy                Z3A.33  ---------------------------------------------------------------------- Vital Signs  Weight (lb): 263                               Height:        5'6"  BMI:         42.44 ---------------------------------------------------------------------- Fetal Evaluation  Num Of Fetuses:          1  Fetal Heart Rate(bpm):   129  Cardiac Activity:        Observed  Presentation:            Variable  Placenta:                Posterior   P. Cord Insertion:       Visualized  Amniotic Fluid  AFI FV:      Within normal limits  AFI Sum(cm)     %Tile       Largest Pocket(cm)  20.22           76          6.3  RUQ(cm)       RLQ(cm)       LUQ(cm)        LLQ(cm)  6.3           4.7           4.21           5.01 ---------------------------------------------------------------------- Biometry  BPD:      87.5  mm     G. Age:  35w 2d         87  %    CI:        73.45   %    70 - 86                                                          FL/HC:       19.3  %    19.4 - 21.8  HC:      324.4  mm     G. Age:  36w 5d         85  %    HC/AC:       1.08       0.96 - 1.11  AC:      301.5  mm     G. Age:  34w 1d         64  %    FL/BPD:      71.5  %    71 - 87  FL:       62.6  mm     G. Age:  32w 3d         12  %    FL/AC:       20.8  %    20 - 24  LV:        4.8  mm  Est. FW:    2329   gm     5 lb 2 oz     65  % ---------------------------------------------------------------------- OB History  Gravidity:    4         Term:   3        Prem:   0        SAB:   0  TOP:          0       Ectopic:  0        Living: 3 ---------------------------------------------------------------------- Gestational Age  LMP:           31w 5d        Date:  12/22/17                 EDD:   09/28/18  U/S Today:     34w 5d                                        EDD:   09/07/18  Best:          33w 5d     Det. By:  U/S  (07/05/18)          EDD:   09/14/18 ---------------------------------------------------------------------- Anatomy  Cranium:               Appears normal  Aortic Arch:            Previously seen  Cavum:                 Appears normal         Ductal Arch:            Not well visualized  Ventricles:            Appears normal         Diaphragm:              Appears normal  Choroid Plexus:        Previously seen        Stomach:                Appears normal, left                                                                        sided  Cerebellum:            Previously seen         Abdomen:                Appears normal  Posterior Fossa:       Previously seen        Abdominal Wall:         Not well visualized  Nuchal Fold:           Not applicable (>20    Cord Vessels:           Previously seen                         wks GA)  Face:                  Appears normal         Kidneys:                Appear normal                         (orbits and profile)  Lips:                  Appears normal         Bladder:                Appears normal  Thoracic:              Appears normal         Spine:                  Previously seen  Heart:                 Previously seen        Upper Extremities:      Previously seen  RVOT:                  Previously seen        Lower Extremities:      Previously seen  LVOT:                  Appears normal  Other:  Fetus appears to be a female. Heels visualized. ---------------------------------------------------------------------- Impression  Normal interval growth.  Suboptimal views of the anterior abdominal wall were seen  secondary to fetal gestational age and position. ---------------------------------------------------------------------- Recommendations  Continue serial growth  Initiate weekly testing in 2 weeks given AMA status. ----------------------------------------------------------------------               Lin Landsman, MD Electronically Signed Final Report   08/01/2018 02:03 pm ----------------------------------------------------------------------   Assessment and Plan:  Pregnancy: [id number] at [redacted]w[redacted]d  1. Gestational diabetes mellitus (GDM) in third trimester, gestational diabetes method of control unspecified Did not bring sugar log, but reports postprandials in 80-100s, however has fastings all between 96-106.  She has tried bedtime snack, exercising after dinner but this does not help her fasting BS. Counseled about need for medication therapy.  Recommended the gold standard of insulin, she declined this. Also discussed Glyburide, but  discussed risk of hypoglycemia. She wants to try this, will start at very low level and titrate up as needed.  - glyBURIDE (DIABETA) 2.5 MG tablet; Take 0.5 tablets (1.25 mg total) by mouth at bedtime.  Dispense: 30 tablet; Refill: 1 - Fetal nonstress test done today was reviewed and was found to be reactive. BPP scheduled for today at MFM, will follow up results  Continue recommended antenatal testing and prenatal care. Scheduled already for weekly BPP and serial growth scans for AMA >40 at MFM. Given that she will now have A2GDM, discussed recommendation for IOL at 39 weeks if no spontaneous labor by then, or earlier if indicated by concerning maternal-fetal conditions.  Patient declines IOL at 39 weeks, wants "to wait until baby is ready to come on his own".  Discussed increased risk of IUFD, other maternal-fetal morbidity and mortality given her age, A2GDM at over [redacted] weeks gestation. She still insisted that she hoped to go into labor spontaneously before than time, and she is unwilling to undergo induction because of increased risk of cesarean section.  She was told that many studies have shown this was not the case, but she is insistent. She was told that nothing will be scheduled now anyway, we will re-discuss this in the subsequent weeks.  2. Advanced maternal age in multigravida, third trimester - Fetal nonstress test done today was reviewed and was found to be reactive. BPP scheduled for today at MFM, will follow up results  Continue recommended antenatal testing and prenatal care.  Scheduled already for weekly BPP and serial growth scans for AMA >40 at MFM.  3. Supervision of high risk pregnancy, antepartum Preterm labor symptoms and general obstetric precautions including but not limited to vaginal bleeding, contractions, leaking of fluid and fetal movement were reviewed in detail with the patient. Please refer to After Visit Summary for other counseling recommendations.  Return in about 1 week  (around 08/30/2018) for NST, OB Visit, Pelvic cultures.  Future Appointments  Date Time Provider Department Center  08/23/2018  3:30 PM [facility] [place] 1 [facility]  08/29/2018 10:30 AM [facility] [place] 1 [facility]  08/30/2018  2:15 PM Reva Bores, MD [facility]  09/06/2018 10:45 AM Allie Bossier, MD [facility]  09/12/2018  1:45 PM Westmere Bing, MD [facility]    Jaynie Collins, MD

## 2018-08-29 ENCOUNTER — Encounter (HOSPITAL_COMMUNITY): Payer: Self-pay

## 2018-08-29 ENCOUNTER — Ambulatory Visit (HOSPITAL_COMMUNITY)
Admission: RE | Admit: 2018-08-29 | Discharge: 2018-08-29 | Disposition: A | Discharge status: Home / Self Care | Payer: Medicaid Other | Source: Ambulatory Visit | Attending: Obstetrics and Gynecology | Admitting: Obstetrics and Gynecology

## 2018-08-29 ENCOUNTER — Other Ambulatory Visit (HOSPITAL_COMMUNITY): Payer: Self-pay | Admitting: *Deleted

## 2018-08-29 DIAGNOSIS — Z3A36 36 weeks gestation of pregnancy: Secondary | ICD-10-CM

## 2018-08-29 DIAGNOSIS — O09529 Supervision of elderly multigravida, unspecified trimester: Secondary | ICD-10-CM

## 2018-08-29 DIAGNOSIS — O0933 Supervision of pregnancy with insufficient antenatal care, third trimester: Secondary | ICD-10-CM

## 2018-08-29 DIAGNOSIS — O09523 Supervision of elderly multigravida, third trimester: Secondary | ICD-10-CM | POA: Diagnosis not present

## 2018-08-30 ENCOUNTER — Other Ambulatory Visit (HOSPITAL_COMMUNITY)
Admission: RE | Admit: 2018-08-30 | Discharge: 2018-08-30 | Disposition: A | Discharge status: Home / Self Care | Payer: Medicaid Other | Source: Ambulatory Visit | Attending: Family Medicine | Admitting: Family Medicine

## 2018-08-30 ENCOUNTER — Ambulatory Visit (INDEPENDENT_AMBULATORY_CARE_PROVIDER_SITE_OTHER): Payer: Medicaid Other | Admitting: Family Medicine

## 2018-08-30 VITALS — BP 134/76 | Wt 261.0 lb

## 2018-08-30 DIAGNOSIS — O0993 Supervision of high risk pregnancy, unspecified, third trimester: Secondary | ICD-10-CM

## 2018-08-30 DIAGNOSIS — O09523 Supervision of elderly multigravida, third trimester: Secondary | ICD-10-CM

## 2018-08-30 DIAGNOSIS — O0990 Supervision of high risk pregnancy, unspecified, unspecified trimester: Secondary | ICD-10-CM

## 2018-08-30 DIAGNOSIS — O24415 Gestational diabetes mellitus in pregnancy, controlled by oral hypoglycemic drugs: Secondary | ICD-10-CM

## 2018-08-30 NOTE — Patient Instructions (Signed)

## 2018-08-30 NOTE — Progress Notes (Signed)
   PRENATAL VISIT NOTE  Subjective:  Miranda Herrera is a 42 y.o. G4P3003 at 6960w6d being seen today for ongoing prenatal care.  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  Patient reports no complaints.  Contractions: Not present.  .  Movement: Present. Denies leaking of fluid.   The following portions of the patient's history were reviewed and updated as appropriate: allergies, current medications, past family history, past medical history, past social history, past surgical history and problem list. Problem list updated.  Objective:   Vitals:   08/30/18 1422  BP: 134/76  Weight: 261 lb (118.4 kg)    Fetal Status: Fetal Heart Rate (bpm): NST   Movement: Present  Presentation: Vertex  General:  Alert, oriented and cooperative. Patient is in no acute distress.  Skin: Skin is warm and dry. No rash noted.   Cardiovascular: Normal heart rate noted  Respiratory: Normal respiratory effort, no problems with respiration noted  Abdomen: Soft, gravid, appropriate for gestational age.  Pain/Pressure: Absent     Pelvic: Cervical exam performed Dilation: 1.5 Effacement (%): 20 Station: Ballotable  Extremities: Normal range of motion.  Edema: None  Mental Status: Normal mood and affect. Normal behavior. Normal judgment and thought content.   Assessment and Plan:  Pregnancy: G4P3003 at 6560w6d  1. Gestational diabetes mellitus (GDM) in third trimester controlled on oral hypoglycemic drug No log--recently started on Glyburide. FCBGs improved to < 85. Postprandial CBGs WNL. Good FM U/s 12/9 showed baby at 7 lb 7 oz, 89 %, vtx, nml fluid NST:   Baseline: 140 bpm, Variability: Good {> 6 bpm), Accelerations: Reactive and Decelerations: Absent  Agrees to 39+ week IOL-->will book for 12/26.   - Fetal nonstress test; Future  2. Supervision of high risk pregnancy, antepartum Cultures today - Culture, beta strep (group b only) - GC/Chlamydia probe amp ([place])not at [facility]  3. Advanced maternal age in multigravida, third trimester Continue antepartum testing  Preterm labor symptoms and general obstetric precautions including but not limited to vaginal bleeding, contractions, leaking of fluid and fetal movement were reviewed in detail with the patient. Please refer to After Visit Summary for other counseling recommendations.  Return in about 1 week (around 09/06/2018) for OB visit and NST.  Future Appointments  Date Time Provider Department Center  09/06/2018 10:45 AM Allie Bossierove, Miranda C, MD [facility]  09/09/2018 12:45 PM [facility] US 2 [facility]  09/12/2018  1:45 PM [place] BingPickens, Charlie, MD [facility]  09/15/2018  7:30 AM [facility] ROOM [facility] None    Reva Boresanya S Azyria Osmon, MD

## 2018-08-30 NOTE — Progress Notes (Signed)
Did not bring her blood sugar log. Ranging 80-100.

## 2018-08-31 LAB — GC/CHLAMYDIA PROBE AMP (~~LOC~~) NOT AT ARMC
Chlamydia: NEGATIVE
NEISSERIA GONORRHEA: NEGATIVE

## 2018-09-02 LAB — CULTURE, BETA STREP (GROUP B ONLY): Strep Gp B Culture: NEGATIVE

## 2018-09-06 ENCOUNTER — Telehealth (HOSPITAL_COMMUNITY): Payer: Self-pay | Admitting: *Deleted

## 2018-09-06 ENCOUNTER — Ambulatory Visit (INDEPENDENT_AMBULATORY_CARE_PROVIDER_SITE_OTHER): Payer: Medicaid Other | Admitting: Obstetrics & Gynecology

## 2018-09-06 VITALS — BP 104/69 | HR 101 | Wt 261.0 lb

## 2018-09-06 DIAGNOSIS — Z8759 Personal history of other complications of pregnancy, childbirth and the puerperium: Secondary | ICD-10-CM

## 2018-09-06 DIAGNOSIS — O0993 Supervision of high risk pregnancy, unspecified, third trimester: Secondary | ICD-10-CM

## 2018-09-06 DIAGNOSIS — O24415 Gestational diabetes mellitus in pregnancy, controlled by oral hypoglycemic drugs: Secondary | ICD-10-CM

## 2018-09-06 DIAGNOSIS — O09523 Supervision of elderly multigravida, third trimester: Secondary | ICD-10-CM

## 2018-09-06 DIAGNOSIS — O0990 Supervision of high risk pregnancy, unspecified, unspecified trimester: Secondary | ICD-10-CM

## 2018-09-06 DIAGNOSIS — O99210 Obesity complicating pregnancy, unspecified trimester: Secondary | ICD-10-CM

## 2018-09-06 DIAGNOSIS — O99213 Obesity complicating pregnancy, third trimester: Secondary | ICD-10-CM

## 2018-09-06 NOTE — Telephone Encounter (Signed)
Preadmission screen  

## 2018-09-06 NOTE — Progress Notes (Signed)
   PRENATAL VISIT NOTE  Subjective:  Miranda Herrera is a 42 y.o. [phone] at 7153w6d being seen today for ongoing prenatal care.  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  Patient reports no complaints.  Contractions: Irregular. Vag. Bleeding: None.  Movement: Present. Denies leaking of fluid.   The following portions of the patient's history were reviewed and updated as appropriate: allergies, current medications, past family history, past medical history, past social history, past surgical history and problem list. Problem list updated.  Objective:   Vitals:   09/06/18 1049  BP: 104/69  Pulse: (!) 101  Weight: 261 lb (118.4 kg)    Fetal Status: Fetal Heart Rate (bpm): NST   Movement: Present     General:  Alert, oriented and cooperative. Patient is in no acute distress.  Skin: Skin is warm and dry. No rash noted.   Cardiovascular: Normal heart rate noted  Respiratory: Normal respiratory effort, no problems with respiration noted  Abdomen: Soft, gravid, appropriate for gestational age.  Pain/Pressure: Present     Pelvic: Cervical exam performed        Extremities: Normal range of motion.  Edema: Trace  Mental Status: Normal mood and affect. Normal behavior. Normal judgment and thought content.   Assessment and Plan:  Pregnancy: G4P3003 at 3253w6d  1. Obesity in pregnancy   2. Advanced maternal age in multigravida, third trimester   3. History of precipitous delivery - planning IOL at 39 weeks  4. Supervision of high risk pregnancy, antepartum   5. Gestational diabetes mellitus (GDM) in third trimester controlled on  oral hypoglycemic drug - excellent sugars on glyburide 1.25 mg daily - because her after meal sugars are all less than 110, I have told her that she can check less than 4 times per day. - MFM BPP q Friday, NST here q Tuesday  Preterm labor symptoms and general obstetric precautions including but not limited to vaginal bleeding, contractions, leaking of fluid and fetal movement were reviewed in detail with the patient. Please refer to After Visit Summary for other counseling recommendations.  No follow-ups on file.  Future Appointments  Date Time Provider Department Center  09/09/2018 12:45 PM [facility] US 2 [facility]  09/12/2018  1:45 PM [place] Herrera, Charlie, MD [facility]  09/15/2018  7:30 AM [facility]-BSSCHED ROOM [facility] None    Miranda BossierMyra C Stefan Karen, MD

## 2018-09-09 ENCOUNTER — Encounter (HOSPITAL_COMMUNITY): Payer: Self-pay

## 2018-09-09 ENCOUNTER — Ambulatory Visit (HOSPITAL_COMMUNITY): Admission: RE | Admit: 2018-09-09 | Payer: Medicaid Other | Source: Ambulatory Visit

## 2018-09-09 ENCOUNTER — Ambulatory Visit (HOSPITAL_COMMUNITY)
Admission: RE | Admit: 2018-09-09 | Discharge: 2018-09-09 | Disposition: A | Discharge status: Home / Self Care | Payer: Medicaid Other | Source: Ambulatory Visit | Attending: Maternal and Fetal Medicine | Admitting: Maternal and Fetal Medicine

## 2018-09-09 DIAGNOSIS — O24415 Gestational diabetes mellitus in pregnancy, controlled by oral hypoglycemic drugs: Secondary | ICD-10-CM | POA: Diagnosis not present

## 2018-09-09 DIAGNOSIS — O0933 Supervision of pregnancy with insufficient antenatal care, third trimester: Secondary | ICD-10-CM

## 2018-09-09 DIAGNOSIS — O09529 Supervision of elderly multigravida, unspecified trimester: Secondary | ICD-10-CM

## 2018-09-09 DIAGNOSIS — O09523 Supervision of elderly multigravida, third trimester: Secondary | ICD-10-CM | POA: Insufficient documentation

## 2018-09-09 DIAGNOSIS — Z3A38 38 weeks gestation of pregnancy: Secondary | ICD-10-CM | POA: Diagnosis not present

## 2018-09-12 ENCOUNTER — Ambulatory Visit (INDEPENDENT_AMBULATORY_CARE_PROVIDER_SITE_OTHER): Payer: Medicaid Other | Admitting: Obstetrics and Gynecology

## 2018-09-12 VITALS — BP 110/58 | HR 102 | Wt 262.0 lb

## 2018-09-12 DIAGNOSIS — O0990 Supervision of high risk pregnancy, unspecified, unspecified trimester: Secondary | ICD-10-CM

## 2018-09-12 DIAGNOSIS — O24419 Gestational diabetes mellitus in pregnancy, unspecified control: Secondary | ICD-10-CM | POA: Diagnosis not present

## 2018-09-12 DIAGNOSIS — O0993 Supervision of high risk pregnancy, unspecified, third trimester: Secondary | ICD-10-CM

## 2018-09-12 NOTE — Progress Notes (Addendum)
Prenatal Visit Note Date: 09/12/2018 Clinic: [facility]  Subjective:  Miranda Herrera is a 42 y.o. [phone] at 5062w5d being seen today for ongoing prenatal care.  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  Patient reports no complaints.   Contractions: Irregular. Vag. Bleeding: None.  Movement: Present. Denies leaking of fluid.   The following portions of the patient's history were reviewed and updated as appropriate: allergies, current medications, past family history, past medical history, past social history, past surgical history and problem list. Problem list updated.  Objective:   Vitals:   09/12/18 1404  BP: (!) 110/58  Pulse: (!) 102  Weight: 262 lb (118.8 kg)    Fetal Status: Fetal Heart Rate (bpm): rNST   Movement: Present  Presentation: Vertex  General:  Alert, oriented and cooperative. Patient is in no acute distress.  Skin: Skin is warm and dry. No rash noted.   Cardiovascular: Normal heart rate noted  Respiratory: Normal respiratory effort, no problems with respiration noted  Abdomen: Soft, gravid, appropriate for gestational age. Pain/Pressure: Present     Pelvic:  Cervical exam deferred        Extremities: Normal range of motion.  Edema: None  Mental Status: Normal mood and affect. Normal behavior. Normal judgment and thought content.   Urinalysis:      Assessment and Plan:  Pregnancy: [id number] at 6962w5d  BTL (papers UTD). Normal BS log on qhs glyburide. rNST and 8/8 bpp a few days ago. Already set up for IOL on Thursday  Term labor symptoms and general obstetric precautions including but  not limited to vaginal bleeding, contractions, leaking of fluid and fetal movement were reviewed in detail with the patient. Please refer to After Visit Summary for other counseling recommendations.  Return in about 5 weeks (around 10/17/2018) for pp visit.   [place] BingPickens, Clara Herbison, MD

## 2018-09-15 ENCOUNTER — Other Ambulatory Visit: Payer: Self-pay

## 2018-09-15 ENCOUNTER — Inpatient Hospital Stay (HOSPITAL_COMMUNITY): Payer: Medicaid Other | Admitting: Anesthesiology

## 2018-09-15 ENCOUNTER — Encounter (HOSPITAL_COMMUNITY): Payer: Self-pay

## 2018-09-15 ENCOUNTER — Inpatient Hospital Stay (HOSPITAL_COMMUNITY)
Admission: RE | Admit: 2018-09-15 | Discharge: 2018-09-17 | DRG: 798 | Disposition: A | Discharge status: Home / Self Care | Payer: Medicaid Other | Attending: Obstetrics & Gynecology | Admitting: Obstetrics & Gynecology

## 2018-09-15 DIAGNOSIS — O09529 Supervision of elderly multigravida, unspecified trimester: Secondary | ICD-10-CM

## 2018-09-15 DIAGNOSIS — O24419 Gestational diabetes mellitus in pregnancy, unspecified control: Secondary | ICD-10-CM | POA: Diagnosis present

## 2018-09-15 DIAGNOSIS — O99210 Obesity complicating pregnancy, unspecified trimester: Secondary | ICD-10-CM | POA: Diagnosis present

## 2018-09-15 DIAGNOSIS — O09523 Supervision of elderly multigravida, third trimester: Secondary | ICD-10-CM | POA: Diagnosis present

## 2018-09-15 DIAGNOSIS — O0990 Supervision of high risk pregnancy, unspecified, unspecified trimester: Secondary | ICD-10-CM

## 2018-09-15 DIAGNOSIS — Z3A39 39 weeks gestation of pregnancy: Secondary | ICD-10-CM

## 2018-09-15 DIAGNOSIS — O0930 Supervision of pregnancy with insufficient antenatal care, unspecified trimester: Secondary | ICD-10-CM

## 2018-09-15 DIAGNOSIS — Z302 Encounter for sterilization: Secondary | ICD-10-CM

## 2018-09-15 DIAGNOSIS — O99214 Obesity complicating childbirth: Secondary | ICD-10-CM | POA: Diagnosis present

## 2018-09-15 DIAGNOSIS — O24425 Gestational diabetes mellitus in childbirth, controlled by oral hypoglycemic drugs: Secondary | ICD-10-CM | POA: Diagnosis present

## 2018-09-15 DIAGNOSIS — O24414 Gestational diabetes mellitus in pregnancy, insulin controlled: Secondary | ICD-10-CM

## 2018-09-15 HISTORY — DX: Major depressive disorder, single episode, unspecified: F32.9

## 2018-09-15 HISTORY — DX: Depression, unspecified: F32.A

## 2018-09-15 LAB — CBC
HCT: 36.8 % (ref 36.0–46.0)
Hemoglobin: 12 g/dL (ref 12.0–15.0)
MCH: 31.9 pg (ref 26.0–34.0)
MCHC: 32.6 g/dL (ref 30.0–36.0)
MCV: 97.9 fL (ref 80.0–100.0)
Platelets: 213 10*3/uL (ref 150–400)
RBC: 3.76 MIL/uL — ABNORMAL LOW (ref 3.87–5.11)
RDW: 14.7 % (ref 11.5–15.5)
WBC: 7.9 10*3/uL (ref 4.0–10.5)
nRBC: 0 % (ref 0.0–0.2)

## 2018-09-15 LAB — GLUCOSE, CAPILLARY
Glucose-Capillary: 84 mg/dL (ref 70–99)
Glucose-Capillary: 83 mg/dL (ref 70–99)
Glucose-Capillary: 73 mg/dL (ref 70–99)
Glucose-Capillary: 73 mg/dL (ref 70–99)

## 2018-09-15 LAB — RPR: RPR Ser Ql: NONREACTIVE

## 2018-09-15 LAB — TYPE AND SCREEN
ABO/RH(D): O POS
ANTIBODY SCREEN: NEGATIVE

## 2018-09-15 MED ORDER — OXYCODONE-ACETAMINOPHEN 5-325 MG PO TABS: 2.0000 | ORAL_TABLET | ORAL | Status: DC | PRN

## 2018-09-15 MED ORDER — SOD CITRATE-CITRIC ACID 500-334 MG/5ML PO SOLN: 30.0000 mL | ORAL | Status: DC | PRN

## 2018-09-15 MED ORDER — ONDANSETRON HCL 4 MG/2ML IJ SOLN: 4.0000 mg | Freq: Four times a day (QID) | INTRAMUSCULAR | Status: DC | PRN

## 2018-09-15 MED ORDER — FLEET ENEMA 7-19 GM/118ML RE ENEM: 1.0000 | ENEMA | RECTAL | Status: DC | PRN

## 2018-09-15 MED ORDER — TERBUTALINE SULFATE 1 MG/ML IJ SOLN
0.2500 mg | Freq: Once | INTRAMUSCULAR | Status: DC | PRN
  Filled 2018-09-15: qty 1

## 2018-09-15 MED ORDER — PHENYLEPHRINE 40 MCG/ML (10ML) SYRINGE FOR IV PUSH (FOR BLOOD PRESSURE SUPPORT)
80.0000 ug | PREFILLED_SYRINGE | INTRAVENOUS | Status: DC | PRN
  Filled 2018-09-15 (×2): qty 10

## 2018-09-15 MED ORDER — PHENYLEPHRINE 40 MCG/ML (10ML) SYRINGE FOR IV PUSH (FOR BLOOD PRESSURE SUPPORT)
80.0000 ug | PREFILLED_SYRINGE | INTRAVENOUS | Status: DC | PRN
  Filled 2018-09-15: qty 10

## 2018-09-15 MED ORDER — LACTATED RINGERS IV SOLN
500.0000 mL | INTRAVENOUS | Status: DC | PRN
  Administered 2018-09-15: 500 mL via INTRAVENOUS

## 2018-09-15 MED ORDER — ACETAMINOPHEN 325 MG PO TABS: 650.0000 mg | ORAL_TABLET | ORAL | Status: DC | PRN

## 2018-09-15 MED ORDER — OXYCODONE-ACETAMINOPHEN 5-325 MG PO TABS: 1.0000 | ORAL_TABLET | ORAL | Status: DC | PRN

## 2018-09-15 MED ORDER — OXYTOCIN 40 UNITS IN LACTATED RINGERS INFUSION - SIMPLE MED
1.0000 m[IU]/min | INTRAVENOUS | Status: DC
  Administered 2018-09-15: 2 m[IU]/min via INTRAVENOUS
  Filled 2018-09-15: qty 1000

## 2018-09-15 MED ORDER — OXYTOCIN BOLUS FROM INFUSION
500.0000 mL | Freq: Once | INTRAVENOUS | Status: AC
  Administered 2018-09-15: 500 mL/h via INTRAVENOUS

## 2018-09-15 MED ORDER — LIDOCAINE HCL (PF) 1 % IJ SOLN
INTRAMUSCULAR | Status: DC | PRN
  Administered 2018-09-15 (×2): 5 mL via EPIDURAL

## 2018-09-15 MED ORDER — FENTANYL 2.5 MCG/ML BUPIVACAINE 1/10 % EPIDURAL INFUSION (WH - ANES)
14.0000 mL/h | INTRAMUSCULAR | Status: DC | PRN
  Administered 2018-09-15: 14 mL/h via EPIDURAL
  Filled 2018-09-15: qty 100

## 2018-09-15 MED ORDER — MISOPROSTOL 25 MCG QUARTER TABLET
25.0000 ug | ORAL_TABLET | ORAL | Status: DC | PRN
  Administered 2018-09-15: 25 ug via VAGINAL
  Filled 2018-09-15 (×2): qty 1

## 2018-09-15 MED ORDER — OXYTOCIN 40 UNITS IN LACTATED RINGERS INFUSION - SIMPLE MED
2.5000 [IU]/h | INTRAVENOUS | Status: DC
  Administered 2018-09-15: 2.5 [IU]/h via INTRAVENOUS

## 2018-09-15 MED ORDER — LACTATED RINGERS IV SOLN: 500.0000 mL | Freq: Once | INTRAVENOUS | Status: DC

## 2018-09-15 MED ORDER — DIPHENHYDRAMINE HCL 50 MG/ML IJ SOLN: 12.5000 mg | INTRAMUSCULAR | Status: DC | PRN

## 2018-09-15 MED ORDER — LIDOCAINE HCL (PF) 1 % IJ SOLN
30.0000 mL | INTRAMUSCULAR | Status: DC | PRN
  Administered 2018-09-15: 30 mL via SUBCUTANEOUS
  Filled 2018-09-15: qty 30

## 2018-09-15 MED ORDER — EPHEDRINE 5 MG/ML INJ
10.0000 mg | INTRAVENOUS | Status: DC | PRN
  Filled 2018-09-15: qty 2

## 2018-09-15 MED ORDER — LACTATED RINGERS IV SOLN
INTRAVENOUS | Status: DC
  Administered 2018-09-15 (×4): via INTRAVENOUS

## 2018-09-15 NOTE — Progress Notes (Signed)
Vitals:   09/15/18 1829 09/15/18 1901  BP: (!) 106/53 (!) 103/54  Pulse: 67 64  Resp: 20 18  Temp:    SpO2:      Comfortable w/epidural AROM wclear fluid. IUPC/FSE placed. Ctx not picking up right now. FHR Cat 1. Pitocin at 10 mu/min. Continue present mgt.

## 2018-09-15 NOTE — Progress Notes (Signed)
Pt refusing CBG. Risks discussed with pt. Pt states she understands.

## 2018-09-15 NOTE — Anesthesia Preprocedure Evaluation (Addendum)
Anesthesia Evaluation  Patient identified by MRN, date of birth, ID band Patient awake    Reviewed: Allergy & Precautions, H&P , NPO status , Patient's Chart, lab work & pertinent test results  History of Anesthesia Complications Negative for: history of anesthetic complications  Airway Mallampati: II  TM Distance: >3 FB Neck ROM: full    Dental no notable dental hx.    Pulmonary neg pulmonary ROS,    Pulmonary exam normal        Cardiovascular negative cardio ROS Normal cardiovascular exam Rhythm:regular Rate:Normal     Neuro/Psych  Headaches, PSYCHIATRIC DISORDERS Depression    GI/Hepatic negative GI ROS, Neg liver ROS,   Endo/Other  diabetes, GestationalMorbid obesity  Renal/GU negative Renal ROS  negative genitourinary   Musculoskeletal   Abdominal   Peds  Hematology negative hematology ROS (+)   Anesthesia Other Findings   Reproductive/Obstetrics (+) Pregnancy                            Anesthesia Physical Anesthesia Plan  ASA: III  Anesthesia Plan: Epidural   Post-op Pain Management:    Induction:   PONV Risk Score and Plan: 2 and Treatment may vary due to age or medical condition  Airway Management Planned: Natural Airway  Additional Equipment:   Intra-op Plan:   Post-operative Plan:   Informed Consent: I have reviewed the patients History and Physical, chart, labs and discussed the procedure including the risks, benefits and alternatives for the proposed anesthesia with the patient or authorized representative who has indicated his/her understanding and acceptance.   Dental advisory given  Plan Discussed with: CRNA  Anesthesia Plan Comments:        Anesthesia Quick Evaluation

## 2018-09-15 NOTE — Progress Notes (Signed)
Miranda Herrera is a 42 y.o. [phone] at 5723w1d by  ultrasound admitted for induction of labor [facility] A2[facility].  Subjective: Resting comfortably with partner at bedside. Aware of contractions but rates discomfort as 3/10 without aid of IV analgesia or need for focused breathing  Objective: BP (!) 110/59   Pulse 67   Temp 98 F (36.7 C) (Oral)   Resp 18   LMP 12/22/2017 (Approximate)  No intake/output data recorded. No intake/output data recorded.  FHT:  FHR: 130 bpm, variability: moderate,  accelerations:  Present,  decelerations:  Absent UC:   irregular, every 1.5-4 minutes SVE:   Dilation: 3 Effacement (%): 60 Station: Ballotable Exam by:: Miranda Herrera, CNM  Labs: Lab Results  Component Value Date   WBC 7.9 09/15/2018   HGB 12.0 09/15/2018   HCT 36.8 09/15/2018   MCV 97.9 09/15/2018   PLT 213 09/15/2018    Assessment / Plan: IOL s/p foley bulb and Cytotec x 1 SVE essentially unchanged from RN check at 1030 POCT blood glucose WNL Start Pitocin now, orders placed for 2 x 2 Fetal Wellbeing:  Category I Pain Control:  Epidural Anticipated MOD:  NSVD  [place] CantorSamantha C Forrest Jaroszewski, CNM 09/15/2018, 1:35 PM

## 2018-09-15 NOTE — H&P (Signed)
Miranda PigeonMarissa Sopko is a 42 y.o. female presenting for induction of labor in setting of A2GDM (Glyburide qhs) and AMA . [facility]. Dating is based on ultrasound performed at 29 weeks. She denies vaginal bleeding, leaking of fluid, decreased fetal movement, fever, falls, or recent illness at time of admission.  OB History    Gravida  4   Para  3   Term  3   Preterm  0   AB  0   Living  3     SAB  0   TAB  0   Ectopic  0   Multiple  0   Live Births  3          Patient Active Problem List   Diagnosis Date Noted  . AMA (advanced maternal age) multigravida 35+ 09/15/2018  . GDM (gestational diabetes mellitus) 07/21/2018  . Supervision of high risk pregnancy, antepartum 06/28/2018  . Advanced maternal age in multigravida, third trimester 06/28/2018  . Late prenatal care affecting pregnancy 06/28/2018  . Obesity in pregnancy 06/28/2018  . BMI 40.0-44.9, adult (HCC) 06/28/2018  . History of precipitous delivery 05/23/2017  . 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 Depression    PPD with 05/23/2017 delivery, wellbutrin  . Gestational diabetes   . Headache(784.0)   . Hx of varicella    Past Surgical History:  Procedure Laterality Date  . EYE SURGERY     lasik   Family History: family history includes Birth defects in her sister; Cancer in her paternal grandmother; Diabetes in her maternal uncle, paternal grandmother, and sister; Healthy in her father and mother. Social History:  reports that she has never smoked. She has never used smokeless tobacco. She reports that she does not drink alcohol or use drugs.     Maternal Diabetes: Yes:  Diabetes Type:  Insulin/Medication controlled  Glyburide 1.25mg  q HS Genetic Screening: Normal Maternal Ultrasounds/Referrals: Normal Fetal Ultrasounds or other Referrals:  None Maternal Substance Abuse:  No Significant Maternal Medications:  None Significant  Maternal Lab Results:  None Other Comments:  GBS NEGATIVE  Review of Systems  Constitutional: Negative for chills and fever.  Respiratory: Negative for shortness of breath.   Gastrointestinal: Negative for abdominal pain, nausea and vomiting.  Neurological: Negative for headaches.  Psychiatric/Behavioral: Negative for depression, hallucinations, substance abuse and suicidal ideas. The patient is not nervous/anxious.   All other systems reviewed and are negative.  Maternal Medical History:  Reason for admission: Nausea.  Contractions: Onset was less than 1 hour ago.   Frequency: rare.   Perceived severity is mild.    Fetal activity: Perceived fetal activity is normal.   Last perceived fetal movement was within the past hour.    Prenatal Complications - Diabetes: gestational. Diabetes is managed by oral agent (monotherapy).      Dilation: 1 Effacement (%): 50 Station: Ballotable Exam by:: S., CNM Last menstrual period 12/22/2017, unknown if currently breastfeeding. Maternal Exam:  Uterine Assessment: Contraction strength is mild.  Contraction frequency is rare.   Abdomen: Patient reports no abdominal tenderness. Fetal presentation: vertex  Introitus: Normal vulva. Normal vagina.  Vagina is negative for discharge.    Physical Exam  Nursing note and vitals reviewed. Constitutional: She is oriented to person, place, and time. She appears well-developed and well-nourished.  Cardiovascular: Normal rate.  Respiratory: Effort normal and breath sounds normal. No respiratory distress.  GI: Normal appearance. There is  no abdominal tenderness.  Gravid  Genitourinary:    Vulva, vagina and uterus normal.     No vaginal discharge.   Musculoskeletal: Normal range of motion.  Neurological: She is alert and oriented to person, place, and time.  Skin: Skin is warm and dry.  Psychiatric: She has a normal mood and affect. Her behavior is normal. Judgment and thought content  normal.   Fetal Wellbeing: Baseline 140, moderate variability, positive 15 x 15 accelerations, no decelerations Toco: rare contractions not felt by patient  Prenatal labs: ABO, Rh: O/Positive/-- (10/22 1410) Antibody: Negative (10/22 1410) Rubella: 5.98 (10/22 1410) RPR: Non Reactive (10/29 0940)  HBsAg: Negative (10/22 1410)  HIV: Non Reactive (10/29 0940)  GBS:   NEGATIVE  Assessment/Plan: --42 y.o. [id number] at 667w1d presenting for IOL --Category 1 fetal tracing --Rare contractions, not laboring --EFW 3387g (89%) 08/29/18 --Proven pelvis 3771g  --Foley bulb and Cytotec PV placed 0930  --Planning inpatient BTL, consent signed 07/12/18 --Boy inpatient circ/breast/inpatient BTL --Planning epidural in active labor --History of rapid second stage. Discussed early room setup with RN and patient  Bridge to Pitocin with possible AROM as indicated Anticipate NSVD  Calvert CantorSamantha C , CNM 09/15/2018, 9:38 AM

## 2018-09-15 NOTE — Anesthesia Pain Management Evaluation Note (Signed)
  CR[facility] Pain Management Visit Note  Patient: Elisabeth PigeonMarissa Marcelli, 42 y.o., female  "Hello [facility] am a [facility]. We have an anesthesia team available at all times to provide care throughout the hospital, including epidural management and anesthesia for C-section. I don't know your plan for the delivery whether it a natural birth, water birth, IV sedation, nitrous supplementation, doula or epidural, but we want to meet your pain goals."   1.Was your pain managed to your expectations on prior hospitalizations?   Yes   2.What is your expectation for pain management during this hospitalization?     Epidural  3.How can we help you reach that goal? Epidural   Record the patient's initial score and the patient's pain goal.   Pain: 0  Pain Goal: 5 [facility] wants you to be able to say your pain was always managed very well.  The PolyclinicMARSHALL,Keyanna Sandefer 09/15/2018

## 2018-09-15 NOTE — Anesthesia Procedure Notes (Signed)
Epidural Patient location during procedure: OB Start time: 09/15/2018 3:45 PM End time: 09/15/2018 4:03 PM  Staffing Anesthesiologist: Lucretia KernWitman, Lark Runk E, MD Performed: anesthesiologist   Preanesthetic Checklist Completed: patient identified, pre-op evaluation, timeout performed, IV checked, risks and benefits discussed and monitors and equipment checked  Epidural Patient position: sitting Prep: DuraPrep Patient monitoring: heart rate, continuous pulse ox and blood pressure Approach: midline Location: L3-L4 Injection technique: LOR air and LOR saline  Needle:  Needle type: Tuohy  Needle gauge: 17 G Needle length: 9 cm Needle insertion depth: 6 cm Catheter type: closed end flexible Catheter size: 19 Gauge Catheter at skin depth: 11 cm  Assessment Events: blood not aspirated, injection not painful, no injection resistance, negative IV test and no paresthesia  Additional Notes Reason for block:procedure for pain

## 2018-09-15 NOTE — Progress Notes (Signed)
Miranda PigeonMarissa Herrera is a 42 y.o. [id number] at 5520w1d admitted for IOL for A2GDM and AMA.   Subjective: S/p epidural placement. Denies pain or pressure at this time.   Objective: BP 120/60   Pulse 86   Temp 98.2 F (36.8 C) (Oral)   Resp 18   LMP 12/22/2017 (Approximate)   SpO2 99%  No intake/output data recorded. No intake/output data recorded.  FHT:  FHR: 140 bpm, variability: moderate,  accelerations:  Present,  decelerations:  Absent UC:   regular, every 2-3 minutes SVE:   Dilation: 4 Effacement (%): 60 Station: Ballotable Exam by:: [place] NewcomerStephanie Faulk RN  Labs: Lab Results  Component Value Date   WBC 7.9 09/15/2018   HGB 12.0 09/15/2018   HCT 36.8 09/15/2018   MCV 97.9 09/15/2018   PLT 213 09/15/2018    Assessment / Plan: Induction of labor due to A2GDM and AMA,  progressing well on pitocin  Labor: Progressing on Pitocin, will continue to increase then AROM Fetal Wellbeing:  Category I Pain Control:  Epidural I/D:  n/a Anticipated MOD:  NSVD  Calvert CantorSamantha C Weinhold, CNM 09/15/2018, 4:31 PM

## 2018-09-16 ENCOUNTER — Telehealth: Payer: Self-pay | Admitting: Radiology

## 2018-09-16 ENCOUNTER — Encounter (HOSPITAL_COMMUNITY)
Admission: RE | Disposition: A | Discharge status: Home / Self Care | Payer: Self-pay | Source: Home / Self Care | Attending: Obstetrics & Gynecology

## 2018-09-16 ENCOUNTER — Encounter (HOSPITAL_COMMUNITY): Payer: Self-pay

## 2018-09-16 DIAGNOSIS — Z302 Encounter for sterilization: Secondary | ICD-10-CM

## 2018-09-16 HISTORY — PX: TUBAL LIGATION: SHX77

## 2018-09-16 SURGERY — LIGATION, FALLOPIAN TUBE, POSTPARTUM
Anesthesia: Epidural

## 2018-09-16 MED ORDER — METHYLERGONOVINE MALEATE 0.2 MG PO TABS: 0.2000 mg | ORAL_TABLET | ORAL | Status: DC | PRN

## 2018-09-16 MED ORDER — MEASLES, MUMPS & RUBELLA VAC IJ SOLR
0.5000 mL | Freq: Once | INTRAMUSCULAR | Status: DC
  Filled 2018-09-16: qty 0.5

## 2018-09-16 MED ORDER — CHLOROPROCAINE HCL (PF) 3 % IJ SOLN
INTRAMUSCULAR | Status: DC | PRN
  Administered 2018-09-16 (×2): 10 mL via EPIDURAL

## 2018-09-16 MED ORDER — ONDANSETRON HCL 4 MG/2ML IJ SOLN: 4.0000 mg | INTRAMUSCULAR | Status: DC | PRN

## 2018-09-16 MED ORDER — FENTANYL CITRATE (PF) 100 MCG/2ML IJ SOLN
INTRAMUSCULAR | Status: AC
  Filled 2018-09-16: qty 2

## 2018-09-16 MED ORDER — ZOLPIDEM TARTRATE 5 MG PO TABS: 5.0000 mg | ORAL_TABLET | Freq: Every evening | ORAL | Status: DC | PRN

## 2018-09-16 MED ORDER — FERROUS SULFATE 325 (65 FE) MG PO TABS
325.0000 mg | ORAL_TABLET | Freq: Two times a day (BID) | ORAL | Status: DC
  Administered 2018-09-16 – 2018-09-17 (×2): 325 mg via ORAL
  Filled 2018-09-16 (×2): qty 1

## 2018-09-16 MED ORDER — PRENATAL MULTIVITAMIN CH
1.0000 | ORAL_TABLET | Freq: Every day | ORAL | Status: DC
  Administered 2018-09-16: 1 via ORAL
  Filled 2018-09-16 (×2): qty 1

## 2018-09-16 MED ORDER — LIDOCAINE-EPINEPHRINE (PF) 2 %-1:200000 IJ SOLN
INTRAMUSCULAR | Status: DC | PRN
  Administered 2018-09-16 (×4): 5 mL via EPIDURAL

## 2018-09-16 MED ORDER — OXYCODONE-ACETAMINOPHEN 5-325 MG PO TABS: 1.0000 | ORAL_TABLET | Freq: Four times a day (QID) | ORAL | Status: DC | PRN

## 2018-09-16 MED ORDER — LIDOCAINE-EPINEPHRINE (PF) 2 %-1:200000 IJ SOLN
INTRAMUSCULAR | Status: AC
  Filled 2018-09-16: qty 20

## 2018-09-16 MED ORDER — MIDAZOLAM HCL 2 MG/2ML IJ SOLN
INTRAMUSCULAR | Status: DC | PRN
  Administered 2018-09-16: 2 mg via INTRAVENOUS

## 2018-09-16 MED ORDER — CHLOROPROCAINE HCL (PF) 3 % IJ SOLN
INTRAMUSCULAR | Status: AC
  Filled 2018-09-16: qty 20

## 2018-09-16 MED ORDER — EPHEDRINE 5 MG/ML INJ
INTRAVENOUS | Status: AC
  Filled 2018-09-16: qty 10

## 2018-09-16 MED ORDER — PHENYLEPHRINE HCL 10 MG/ML IJ SOLN
INTRAMUSCULAR | Status: DC | PRN
  Administered 2018-09-16 (×2): 80 ug via INTRAVENOUS

## 2018-09-16 MED ORDER — LACTATED RINGERS IV SOLN
INTRAVENOUS | Status: DC
  Administered 2018-09-16 (×2): via INTRAVENOUS

## 2018-09-16 MED ORDER — METOCLOPRAMIDE HCL 10 MG PO TABS
10.0000 mg | ORAL_TABLET | Freq: Once | ORAL | Status: AC
  Administered 2018-09-16: 10 mg via ORAL
  Filled 2018-09-16: qty 1

## 2018-09-16 MED ORDER — BUPIVACAINE HCL (PF) 0.5 % IJ SOLN
INTRAMUSCULAR | Status: DC | PRN
  Administered 2018-09-16: 30 mL

## 2018-09-16 MED ORDER — IBUPROFEN 600 MG PO TABS
600.0000 mg | ORAL_TABLET | Freq: Four times a day (QID) | ORAL | Status: DC
  Administered 2018-09-16 – 2018-09-17 (×6): 600 mg via ORAL
  Filled 2018-09-16 (×6): qty 1

## 2018-09-16 MED ORDER — COCONUT OIL OIL: 1.0000 "application " | TOPICAL_OIL | Status: DC | PRN

## 2018-09-16 MED ORDER — METHYLERGONOVINE MALEATE 0.2 MG/ML IJ SOLN: 0.2000 mg | INTRAMUSCULAR | Status: DC | PRN

## 2018-09-16 MED ORDER — DOCUSATE SODIUM 100 MG PO CAPS
100.0000 mg | ORAL_CAPSULE | Freq: Two times a day (BID) | ORAL | Status: DC
  Administered 2018-09-16 – 2018-09-17 (×4): 100 mg via ORAL
  Filled 2018-09-16 (×4): qty 1

## 2018-09-16 MED ORDER — ONDANSETRON HCL 4 MG PO TABS: 4.0000 mg | ORAL_TABLET | ORAL | Status: DC | PRN

## 2018-09-16 MED ORDER — FENTANYL CITRATE (PF) 100 MCG/2ML IJ SOLN
INTRAMUSCULAR | Status: DC | PRN
  Administered 2018-09-16: 50 ug via INTRAVENOUS

## 2018-09-16 MED ORDER — BENZOCAINE-MENTHOL 20-0.5 % EX AERO
1.0000 "application " | INHALATION_SPRAY | CUTANEOUS | Status: DC | PRN
  Administered 2018-09-16: 1 via TOPICAL
  Filled 2018-09-16: qty 56

## 2018-09-16 MED ORDER — ACETAMINOPHEN 325 MG PO TABS
650.0000 mg | ORAL_TABLET | ORAL | Status: DC | PRN
  Administered 2018-09-17: 650 mg via ORAL
  Filled 2018-09-16: qty 2

## 2018-09-16 MED ORDER — DIPHENHYDRAMINE HCL 25 MG PO CAPS: 25.0000 mg | ORAL_CAPSULE | Freq: Four times a day (QID) | ORAL | Status: DC | PRN

## 2018-09-16 MED ORDER — WITCH HAZEL-GLYCERIN EX PADS: 1.0000 "application " | MEDICATED_PAD | CUTANEOUS | Status: DC | PRN

## 2018-09-16 MED ORDER — LACTATED RINGERS IV SOLN: INTRAVENOUS | Status: DC | PRN

## 2018-09-16 MED ORDER — PHENYLEPHRINE 40 MCG/ML (10ML) SYRINGE FOR IV PUSH (FOR BLOOD PRESSURE SUPPORT)
PREFILLED_SYRINGE | INTRAVENOUS | Status: AC
  Filled 2018-09-16: qty 10

## 2018-09-16 MED ORDER — MIDAZOLAM HCL 2 MG/2ML IJ SOLN
INTRAMUSCULAR | Status: AC
  Filled 2018-09-16: qty 2

## 2018-09-16 MED ORDER — SIMETHICONE 80 MG PO CHEW: 80.0000 mg | CHEWABLE_TABLET | ORAL | Status: DC | PRN

## 2018-09-16 MED ORDER — DIBUCAINE 1 % RE OINT: 1.0000 "application " | TOPICAL_OINTMENT | RECTAL | Status: DC | PRN

## 2018-09-16 MED ORDER — TETANUS-DIPHTH-ACELL PERTUSSIS 5-2.5-18.5 LF-MCG/0.5 IM SUSP: 0.5000 mL | Freq: Once | INTRAMUSCULAR | Status: DC

## 2018-09-16 MED ORDER — FLEET ENEMA 7-19 GM/118ML RE ENEM: 1.0000 | ENEMA | Freq: Every day | RECTAL | Status: DC | PRN

## 2018-09-16 MED ORDER — FAMOTIDINE 20 MG PO TABS
40.0000 mg | ORAL_TABLET | Freq: Once | ORAL | Status: AC
  Administered 2018-09-16: 40 mg via ORAL
  Filled 2018-09-16: qty 2

## 2018-09-16 MED ORDER — BISACODYL 10 MG RE SUPP: 10.0000 mg | Freq: Every day | RECTAL | Status: DC | PRN

## 2018-09-16 SURGICAL SUPPLY — 26 items
BENZOIN TINCTURE PRP APPL 2/3 (GAUZE/BANDAGES/DRESSINGS) IMPLANT
BLADE SURG 11 STRL SS (BLADE) ×3 IMPLANT
CLOTH BEACON ORANGE TIMEOUT ST (SAFETY) ×3 IMPLANT
DRESSING OPSITE X SMALL 2X3 (GAUZE/BANDAGES/DRESSINGS) ×3 IMPLANT
DRSG OPSITE POSTOP 3X4 (GAUZE/BANDAGES/DRESSINGS) ×3 IMPLANT
DURAPREP 26ML APPLICATOR (WOUND CARE) ×3 IMPLANT
ELECT REM PT RETURN 9FT ADLT (ELECTROSURGICAL) ×3
ELECTRODE REM PT RTRN 9FT ADLT (ELECTROSURGICAL) ×1 IMPLANT
GLOVE BIOGEL PI IND STRL 7.0 (GLOVE) ×3 IMPLANT
GLOVE BIOGEL PI INDICATOR 7.0 (GLOVE) ×6
GLOVE ECLIPSE 7.0 STRL STRAW (GLOVE) ×3 IMPLANT
GOWN STRL REUS W/TWL LRG LVL3 (GOWN DISPOSABLE) ×6 IMPLANT
NEEDLE HYPO 22GX1.5 SAFETY (NEEDLE) ×3 IMPLANT
NS IRRIG 1000ML POUR BTL (IV SOLUTION) ×3 IMPLANT
PACK ABDOMINAL MINOR (CUSTOM PROCEDURE TRAY) ×3 IMPLANT
PENCIL BUTTON HOLSTER BLD 10FT (ELECTRODE) ×3 IMPLANT
PROTECTOR NERVE ULNAR (MISCELLANEOUS) ×3 IMPLANT
SPONGE LAP 4X18 RFD (DISPOSABLE) IMPLANT
SUT PLAIN 0 NONE (SUTURE) ×3 IMPLANT
SUT VIC AB 0 CT1 27 (SUTURE) ×2
SUT VIC AB 0 CT1 27XBRD ANBCTR (SUTURE) ×1 IMPLANT
SUT VICRYL 4-0 PS2 18IN ABS (SUTURE) ×3 IMPLANT
SYR CONTROL 10ML LL (SYRINGE) ×3 IMPLANT
TOWEL OR 17X24 6PK STRL BLUE (TOWEL DISPOSABLE) ×6 IMPLANT
TRAY FOLEY CATH SILVER 14FR (SET/KITS/TRAYS/PACK) ×3 IMPLANT
WATER STERILE IRR 1000ML POUR (IV SOLUTION) ×3 IMPLANT

## 2018-09-16 NOTE — Op Note (Signed)
Miranda Herrera 09/16/2018  PREOPERATIVE DIAGNOSES: Multiparity, undesired fertility  POSTOPERATIVE DIAGNOSES: Multiparity, undesired fertility  PROCEDURE:  Postpartum Bilateral Tubal Sterilization in the [employer] fashion  SURGEON: Dr.  Jaynie CollinsUgonna Garald Herrera  ASSISTANT: Dr. Gwenevere AbbotNimeka Herrera  ANESTHESIA:  Epidural and local analgesia using 30 ml of 0.5% Marcaine  COMPLICATIONS:  None immediate.  ESTIMATED BLOOD LOSS: 10 ml.  INDICATIONS:  42 y.o. [id number] with undesired fertility, status post vaginal delivery, desires permanent sterilization.  Other reversible forms of contraception were discussed with patient; she declines all other modalities. Risks of procedure discussed with patient including but not limited to: risk of regret, permanence of method, bleeding, infection, injury to surrounding organs and need for additional procedures.  Failure risk of 1% with increased risk of ectopic gestation if pregnancy occurs was also discussed with patient.      FINDINGS:  Normal uterus, tubes, and ovaries.  PROCEDURE DETAILS: The patient was taken to the operating room where her epidural anesthesia was dosed up to surgical level and found to be adequate.  She was then placed in the dorsal supine position and prepped and draped in sterile fashion.  After an adequate timeout was performed, attention was turned to the patient's abdomen where a small transverse skin incision was made under the umbilical fold. The incision was taken down to the layer of fascia using the scalpel, and fascia was incised, and extended bilaterally using Mayo scissors. The peritoneum was entered in a sharp fashion. Attention was then turned to the patient's uterus, and  left fallopian tube was then identified, and the Babcock clamp was then used to grasp the tube approximately 2 cm from the cornual region. A 2 cm segment of the tube was then doubly ligated with free tie of plain gut suture, transected and excised. Good hemostasis was noted.  The right fallopian tube was then identified, doubly ligated, and a 2 cm segment excised in a similar fashion allowing for bilateral tubal sterilization.  Good hemostasis was noted overall.  The instruments were then removed from the patient's abdomen and the fascial incision was repaired with 0 Vicryl, and the skin was closed with a 4-0 Vicryl subcuticular stitch. The patient tolerated the procedure well.  Instrument, sponge, and needle counts were correct times three.  The patient was then taken to the recovery room awake and in stable condition.   Miranda CollinsUGONNA  Miranda Ruddy, MD, FACOG Obstetrician & Gynecologist, [facility], [facility]

## 2018-09-16 NOTE — Progress Notes (Signed)
POSTPARTUM PROGRESS NOTE  Subjective:  Miranda Herrera is a 42 y.o. [id number] 6218w1d PPD #1 s/p NSVD for IOL for A2DM.  No acute events overnight.  Pt denies problems with ambulating, voiding or po intake.  She denies nausea or vomiting.  Pain is well controlled.  She has had flatus. She has not had bowel movement.  Lochia Small. Planning for tubal ligation later today.  Objective: Blood pressure 115/60, pulse 72, temperature 97.8 F (36.6 C), temperature source Oral, resp. rate 18, last menstrual period 12/22/2017, SpO2 100 %, unknown if currently breastfeeding.  Physical Exam:  General: alert, cooperative and no distress Lochia:normal flow Chest: no respiratory distress Heart:regular rate, distal pulses intact Incision: NA Abdomen: soft, nontender,  Uterine Fundus: firm, appropriately tender DVT Evaluation: No calf swelling or tenderness Extremities: no pedal edema  Recent Labs    09/15/18 0843  HGB 12.0  HCT 36.8    Assessment/Plan:  ASSESSMENT: Miranda Herrera is a 42 y.o. [id number] 6118w1d PPD #1 s/p SVD.  -plans for BTL today -pain well controlled -breastfeeding, feels this is going well -SW consult for hx PPD complete  Plan for discharge tomorrow   LOS: 1 day   Jaisen Wiltrout H WilsonMD 09/16/2018, 9:35 AM

## 2018-09-16 NOTE — Lactation Note (Signed)
This note was copied from a baby's chart. Lactation Consultation Note  Patient Name: Miranda Herrera [id number]'s Date: 09/16/2018 Reason for consult: Initial assessment;Term  P4 mother whose infant is now 10114 hours old.  Mother has breast feeding experience with all three of her other children.  Mother had no questions/concerns related to breast feeding.  She stated that this baby has latched better than any of the other children.  She denied pain with latching and states that her breasts and nipples feel fine.  She has a DEBP for home use.  Mom made aware of O/P services, breastfeeding support groups, community resources, and our phone # for post-discharge questions. Family present.  Mother will call as needed for assistance.   Maternal Data Formula Feeding for Exclusion: No Has patient been taught Hand Expression?: Yes Does the patient have breastfeeding experience prior to this delivery?: Yes  Feeding Feeding Type: Breast Fed  LATCH Score Latch: Grasps breast easily, tongue down, lips flanged, rhythmical sucking.  Audible Swallowing: A few with stimulation  Type of Nipple: Everted at rest and after stimulation  Comfort (Breast/Nipple): Soft / non-tender  Hold (Positioning): No assistance needed to correctly position infant at breast.  LATCH Score: 9  Interventions    Lactation Tools Discussed/Used WIC Program: Yes   Consult Status Consult Status: Follow-up Date: 09/17/18 Follow-up type: In-patient    Miranda Herrera 09/16/2018, 11:49 AM

## 2018-09-16 NOTE — Progress Notes (Signed)
MOB was referred for history of depression/anxiety. * Referral screened out by Clinical Social Worker because none of the following criteria appear to apply: ~ History of anxiety/depression during this pregnancy, or of post-partum depression following prior delivery. ~ Diagnosis of anxiety and/or depression within last 3 years OR * MOB's symptoms currently being treated with medication and/or therapy. Please contact the Clinical Social Worker if needs arise, by MOB request, or if MOB scores greater than 9/yes to question 10 on Edinburgh Postpartum Depression Screen.  Yamaris Cummings, LCSW Clinical Social Worker  [facility]  [phone]  

## 2018-09-16 NOTE — Telephone Encounter (Signed)
**   CORRECTION **  Left message for postpartum GTT testing on 10/24/18 @ 8:15 with instructions for fasting labs @ [facility]

## 2018-09-16 NOTE — Anesthesia Postprocedure Evaluation (Signed)
Anesthesia Post Note  Patient: Miranda PigeonMarissa Herrera  Procedure(s) Performed: AN AD HOC LABOR EPIDURAL     Patient location during evaluation: Mother Baby Anesthesia Type: Epidural Level of consciousness: awake and alert Pain management: pain level controlled Vital Signs Assessment: post-procedure vital signs reviewed and stable Respiratory status: spontaneous breathing, nonlabored ventilation and respiratory function stable Cardiovascular status: stable Postop Assessment: no headache, no backache and epidural receding Anesthetic complications: no    Last Vitals:  Vitals:   09/16/18 0100 09/16/18 0500  BP: 134/76 115/60  Pulse: 68 72  Resp: 18 18  Temp: 36.9 C 36.6 C  SpO2: 100% 100%    Last Pain:  Vitals:   09/16/18 0718  TempSrc:   PainSc: 0-No pain   Pain Goal:                 Junious SilkGILBERT,Aris Moman

## 2018-09-16 NOTE — Transfer of Care (Signed)
Immediate Anesthesia Transfer of Care Note  Patient: Miranda PigeonMarissa Herrera  Procedure(s) Performed: POST PARTUM TUBAL LIGATION (N/A )  Patient Location: PACU  Anesthesia Type:Epidural  Level of Consciousness: awake, alert  and oriented  Airway & Oxygen Therapy: Patient Spontanous Breathing  Post-op Assessment: Report given to RN and Post -op Vital signs reviewed and stable  Post vital signs: Reviewed and stable  Last Vitals:  Vitals Value Taken Time  BP    Temp    Pulse    Resp    SpO2      Last Pain:  Vitals:   09/16/18 1535  TempSrc: Oral  PainSc:          Complications: No apparent anesthesia complications

## 2018-09-16 NOTE — Telephone Encounter (Signed)
Left message for patient to inform of BP check  appointment on 09/22/18 @ 9:30 and postpartum appointment on 10/12/18 @ 9:15

## 2018-09-17 ENCOUNTER — Encounter (HOSPITAL_COMMUNITY): Payer: Self-pay | Admitting: Obstetrics & Gynecology

## 2018-09-17 MED ORDER — SIMETHICONE 80 MG PO CHEW: 80.0000 mg | CHEWABLE_TABLET | ORAL | 0 refills | Status: DC | PRN

## 2018-09-17 MED ORDER — FERROUS SULFATE 325 (65 FE) MG PO TABS: 325.0000 mg | ORAL_TABLET | Freq: Two times a day (BID) | ORAL | 0 refills | Status: DC

## 2018-09-17 MED ORDER — DOCUSATE SODIUM 100 MG PO CAPS: 100.0000 mg | ORAL_CAPSULE | Freq: Two times a day (BID) | ORAL | 0 refills | Status: DC | PRN

## 2018-09-17 MED ORDER — TRAMADOL HCL 50 MG PO TABS: 50.0000 mg | ORAL_TABLET | Freq: Four times a day (QID) | ORAL | 0 refills | Status: AC | PRN

## 2018-09-17 MED ORDER — ACETAMINOPHEN 325 MG PO TABS: 650.0000 mg | ORAL_TABLET | ORAL | 0 refills | Status: DC | PRN

## 2018-09-17 MED ORDER — IBUPROFEN 600 MG PO TABS: 600.0000 mg | ORAL_TABLET | Freq: Four times a day (QID) | ORAL | 0 refills | Status: DC | PRN

## 2018-09-17 NOTE — Lactation Note (Signed)
Lactation Consultation Note  Patient Name: Miranda PigeonMarissa Dejoy [id number]'s Date: 09/17/2018    Dr. Andrey CampanileWilson made aware of tramadol's L4 status.   Lurline HareRichey, Tiersa Dayley [facility] 09/17/2018, 10:34 AM

## 2018-09-17 NOTE — Lactation Note (Signed)
This note was copied from a baby's chart. Lactation Consultation Note  Patient Name: Boy Elisabeth PigeonMarissa Knisely Today's Date: 09/17/2018   Mom feels that nursing is going well. She feels that infant has "cleared" his meconium & she hears lots of swallows. Mom's most recent child is 7115 mo old.  Per Maisie Fushomas Hale's "Medications & Mother's Milk" & LactMed info on tramadol usage, I asked Mom to stop taking the medicine and let pediatrician know if she observes any of the following: increased sleepiness (compared to what is expected for an infant), not breastfeeding well, or a decrease in respiratory rate. Mom replied that she did not think she was going to fill that prescription b/c she doesn't take pain meds (except for IB).    Lurline HareRichey, Shylynn Bruning [facility] 09/17/2018, 10:49 AM

## 2018-09-17 NOTE — Discharge Summary (Addendum)
Postpartum Discharge Summary     Patient Name: Miranda Herrera DOB: 07/11/1976 MRN: [id number]  Date of admission: 09/15/2018 Delivering Provider: Jacklyn ShellRESENZO-DISHMON, FRANCES   Date of discharge: 09/17/2018  Admitting diagnosis: 39wks induction Intrauterine pregnancy: 841w1d     Secondary diagnosis:  Active Problems:   Supervision of high risk pregnancy, antepartum   Advanced maternal age in multigravida, third trimester   Late prenatal care affecting pregnancy   Obesity in pregnancy   GDM (gestational diabetes mellitus)   AMA (advanced maternal age) multigravida 35+  Additional problems: none     Discharge diagnosis: Term Pregnancy Delivered and status post bilateral tubal ligation                                                                                                Post partum procedures:postpartum tubal ligation  Augmentation: AROM, Pitocin, Cytotec and Foley Balloon  Complications: None  Hospital course:  Induction of Labor With Vaginal Delivery   42 y.o. yo [id number] at 4241w1d was admitted to the hospital 09/15/2018 for induction of labor.  Indication for induction: A2 DM and AMA.  Patient had an uncomplicated labor course as follows: Membrane Rupture Time/Date: 7:22 PM ,09/15/2018   Intrapartum Procedures: Episiotomy: None [1]                                         Lacerations:  2nd degree [3];Perineal [11]  Patient had delivery of a Viable infant.  Information for the patient's newborn:  Miranda Herrera, Miranda Herrera [[id number]][[id number]]  Delivery Method: Vag-Spont   09/15/2018  Details of delivery can be found in separate delivery note.  Patient had a postpartum course remarkable for having a ppBTL on PPD#1. She tolerated this well. Patient is discharged home 09/17/18. On PPD#2 she had a single BP reading of 148/80, but preceding and subsequent BPs were 90-114/57-70s.  Magnesium Sulfate recieved: No BMZ received: No  Physical exam  Vitals:   09/16/18 1955 09/17/18 0000 09/17/18  0400 09/17/18 0607  BP: (!) 115/57 (!) 148/80 114/72 110/62  Pulse: (!) 56 76 72 (!) 58  Resp: 18 18 18 18   Temp: 98.6 F (37 C) 98.3 F (36.8 C) 98.6 F (37 C) 98.5 F (36.9 C)  TempSrc: Oral Oral Oral Oral  SpO2: 99% 100% 100% 99%   General: alert, sitting comfortably in bed, NAD Lochia: appropriate Uterine Fundus: firm Incision: Healing well with no significant drainage, Dressing is clean, dry, and intact DVT Evaluation: No evidence of DVT seen on physical exam. Labs: Lab Results  Component Value Date   WBC 7.9 09/15/2018   HGB 12.0 09/15/2018   HCT 36.8 09/15/2018   MCV 97.9 09/15/2018   PLT 213 09/15/2018   CMP Latest Ref Rng & Units 07/12/2018  Glucose 65 - 99 mg/dL 130(Q110(H)  BUN 6 - 20 mg/dL 4(L)  Creatinine 6.570.57 - 1.00 mg/dL 8.46(N0.51(L)  Sodium 629134 - 528144 mmol/L 138  Potassium 3.5 - 5.2 mmol/L 3.5  Chloride 96 - 106 mmol/L 104  CO2  20 - 29 mmol/L 19(L)  Calcium 8.7 - 10.2 mg/dL 8.7  Total Protein 6.0 - 8.5 g/dL 1.6(X5.4(L)  Total Bilirubin 0.0 - 1.2 mg/dL <0.9<0.2  Alkaline Phos 39 - 117 IU/L 116  AST 0 - 40 IU/L 10  ALT 0 - 32 IU/L 9    Discharge instruction: per After Visit Summary and "Baby and Me Booklet".  After visit meds:  Allergies as of 09/17/2018   No Known Allergies     Medication List    STOP taking these medications   glyBURIDE 2.5 MG tablet Commonly known as:  DIABETA     TAKE these medications   ACCU-CHEK FASTCLIX LANCETS Misc 1 each by Percutaneous route 4 (four) times daily.   acetaminophen 325 MG tablet Commonly known as:  TYLENOL Take 2 tablets (650 mg total) by mouth every 4 (four) hours as needed for mild pain or moderate pain.   docusate sodium 100 MG capsule Commonly known as:  COLACE Take 1 capsule (100 mg total) by mouth 2 (two) times daily as needed for mild constipation.   ferrous sulfate 325 (65 FE) MG tablet Take 1 tablet (325 mg total) by mouth 2 (two) times daily with a meal.   glucose blood test strip Commonly known as:   ACCU-CHEK GUIDE Use one test strip to check blood glucose 4 times daily.   ibuprofen 600 MG tablet Commonly known as:  ADVIL,MOTRIN Take 1 tablet (600 mg total) by mouth every 6 (six) hours as needed.   prenatal multivitamin Tabs tablet Take 1 tablet by mouth daily at 12 noon.   simethicone 80 MG chewable tablet Commonly known as:  MYLICON Chew 1 tablet (80 mg total) by mouth as needed for flatulence.   traMADol 50 MG tablet Commonly known as:  ULTRAM Take 1 tablet (50 mg total) by mouth every 6 (six) hours as needed for up to 5 days.            Discharge Care Instructions  (From admission, onward)         Start     Ordered   09/17/18 0000  Discharge wound care:    Comments:  Leave dressing in place for 7 days   09/17/18 0906          Diet: carb modified diet  Activity: Advance as tolerated. Pelvic rest for 6 weeks.   Outpatient follow up:2 weeks Follow up Appt: Future Appointments  Date Time Provider Department Center  10/18/2018 10:00 AM [place] BingPickens, Charlie, MD [facility]  10/24/2018  8:15 AM [facility]   Follow up Visit:   Please schedule this patient for Postpartum visit in: 4 weeks with the following provider: Any provider For C/S patients schedule nurse incision check in weeks 2 weeks: yes High risk pregnancy complicated by: AMA, GDM Delivery mode:  SVD Anticipated Birth Control:  BTL done PP PP Procedures needed: Incision check  Schedule Integrated BH visit: no      Newborn Data: Live born female  Birth Weight: 7 lb 5.6 oz (3334 g) APGAR: 9, 9  Newborn Delivery   Birth date/time:  09/15/2018 21:23:00 Delivery type:  Vaginal, Spontaneous     Baby Feeding: Breast Disposition:home with mother   09/17/2018 Arabella MerlesKimberly D Devean Skoczylas, CNM  CNM attestation I have seen and examined this patient and agree with above documentation in the resident's note.   Miranda Herrera is a 42 y.o. [id number] s/p SVD and ppBTL.   Pain is  well controlled.  Plan for  birth control is bilateral tubal ligation.  Method of Feeding: breast  PE:  BP 110/62 (BP Location: Right Arm)   Pulse (!) 58   Temp 98.5 F (36.9 C) (Oral)   Resp 18   LMP 12/22/2017 (Approximate)   SpO2 99%   Breastfeeding Unknown  Fundus firm Umbilical inc: honeycomb intact, dry  Recent Labs    09/15/18 0843  HGB 12.0  HCT 36.8     Plan: discharge today - postpartum care discussed - f/u clinic in 4-6 weeks for postpartum visit including GTT   Arabella Merles, CNM 3:25 PM 09/17/2018

## 2018-09-17 NOTE — Anesthesia Postprocedure Evaluation (Signed)
Anesthesia Post Note  Patient: Miranda PigeonMarissa Herrera  Procedure(s) Performed: POST PARTUM TUBAL LIGATION (N/A )     Patient location during evaluation: Mother Baby Anesthesia Type: Epidural Level of consciousness: awake and alert, oriented and patient cooperative Pain management: pain level controlled Vital Signs Assessment: post-procedure vital signs reviewed and stable Respiratory status: spontaneous breathing Cardiovascular status: stable Postop Assessment: no headache, epidural receding, patient able to bend at knees and no signs of nausea or vomiting Anesthetic complications: no Comments: Pain score 2.    Last Vitals:  Vitals:   09/17/18 0400 09/17/18 0607  BP: 114/72 110/62  Pulse: 72 (!) 58  Resp: 18 18  Temp: 37 C 36.9 C  SpO2: 100% 99%    Last Pain:  Vitals:   09/17/18 0608  TempSrc:   PainSc: 0-No pain   Pain Goal:                 [facility],[name]

## 2018-09-19 ENCOUNTER — Encounter (HOSPITAL_COMMUNITY): Payer: Self-pay

## 2018-09-19 NOTE — Addendum Note (Signed)
Addendum  created 09/19/18 1013 by Kaylyn LayerHowze, Illona Bulman E, MD   Intraprocedure Event edited, Intraprocedure Meds edited

## 2018-09-22 ENCOUNTER — Telehealth: Payer: Self-pay | Admitting: *Deleted

## 2018-09-22 NOTE — Telephone Encounter (Signed)
-----   Message from Rozann Lesches, NT sent at 09/22/2018  4:20 PM EST ----- Regarding: patient has rash on belly Patient delivered baby 09/15/18 vag. Tuebal in the 27 th, has bid rash on belly since Monday, please call patient for advise.

## 2018-09-22 NOTE — Telephone Encounter (Signed)
Called pt back regarding her message, pt states she has a large circular rash on her abdomen that came up Sunday. Pt denies any pain, fever or blisters in that area. Pt believes she may be reacting to something they used during her tubal. Instructed patient to use OTC hydrocortisone cream few times a day and to try and keep any soap or anything irritating off the are and see if it doesn't get better. If it worsens to call office back.   Scheryl Marten, RN

## 2018-10-17 NOTE — Progress Notes (Signed)
Obstetrics and Gynecology Postpartum Visit  Appointment Date: 10/18/2018  OBGYN Clinic: [facility]  Primary Care Provider: Patient, No Pcp Per  Chief Complaint:  Chief Complaint  Patient presents with  . Postpartum Care    History of Present Illness: Meko Mortier is a 43 y.o. Caucasian [id number] (Patient's last menstrual period was 12/22/2017 (approximate).), seen for the above chief complaint. Her past medical history is significant for AMA, GDMa2   She is s/p SVD/2nd on 12/26; she was discharged to home on PPD#2  Vaginal bleeding or discharge: No  Breast or formula feeding: both Intercourse: No  Contraception after delivery: ppBTL PP depression s/s: No  Any bowel or bladder issues: No  Pap smear: no abnormalities (date: 2019)  Review of Systems: as noted in the History of Present Illness.   Medications PNV  Allergies Patient has no known allergies.  Physical Exam:  LMP 12/22/2017 (Approximate)  There is no height or weight on file to calculate BMI. General appearance: Well nourished, well developed female in no acute distress.  Respiratory:. Normal respiratory effort Neuro/Psych:  Normal mood and affect.  Skin:  Warm and dry.  Lymphatic:  No inguinal lymphadenopathy.   Pelvic exam: is not limited by body habitus EGBUS: within normal limits Vagina: a few small vicryl sutures and strands were cut and removed at the posterior aspect of the introitus, well healed and within normal limits and with No blood in the vault, Cervix:  no lesions or cervical motion tenderness Uterus:  Nonenlarged, nttp Adnexa:  normal adnexa and no mass, fullness, tenderness Rectovaginal: deferred  Laboratory: none  PP Depression Screening:  EPDS score 2  Assessment: pt doing well  Plan:  Routine care. Recommend pelvic rest for two more weeks  Pt to come back for 2h PP GTT  RTC 1wk  Cornelia Copa MD Attending Center for [facility])

## 2018-10-18 ENCOUNTER — Encounter: Payer: Self-pay | Admitting: Obstetrics and Gynecology

## 2018-10-18 ENCOUNTER — Ambulatory Visit (INDEPENDENT_AMBULATORY_CARE_PROVIDER_SITE_OTHER): Payer: Medicaid Other | Admitting: Obstetrics and Gynecology

## 2018-10-24 ENCOUNTER — Other Ambulatory Visit: Payer: Medicaid Other

## 2018-10-25 ENCOUNTER — Encounter: Payer: Self-pay | Admitting: Obstetrics & Gynecology

## 2018-10-25 LAB — GLUCOSE TOLERANCE, 2 HOURS
Glucose, 2 hour: 85 mg/dL (ref 65–139)
Glucose, GTT - Fasting: 89 mg/dL (ref 65–99)

## 2019-09-10 IMAGING — US US MFM FETAL BPP W/O NON-STRESS
1 series · 14 of 28 positions shown · non-contrast
Comparison: none

[Series 1: us mfm fetal bpp w/o non-stress · 38 acquisitions, 14 frames shown]
[im 2/38]
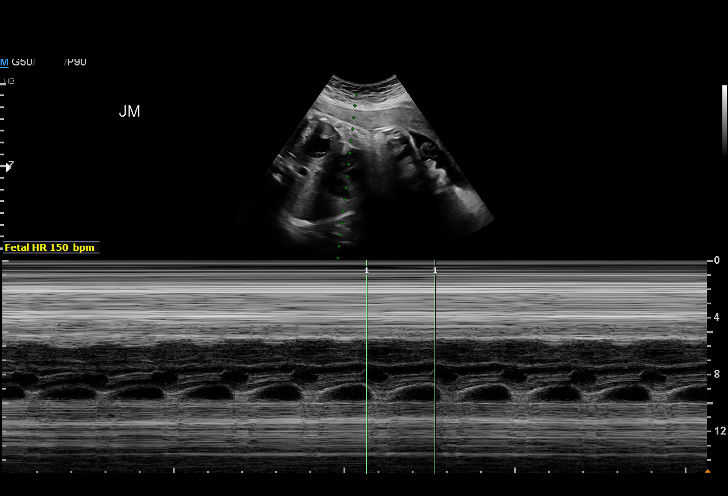
[im 5/38]
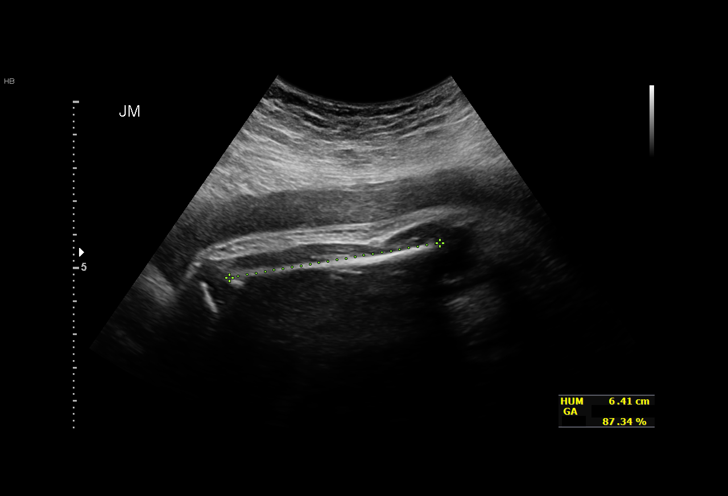
[im 7/38]
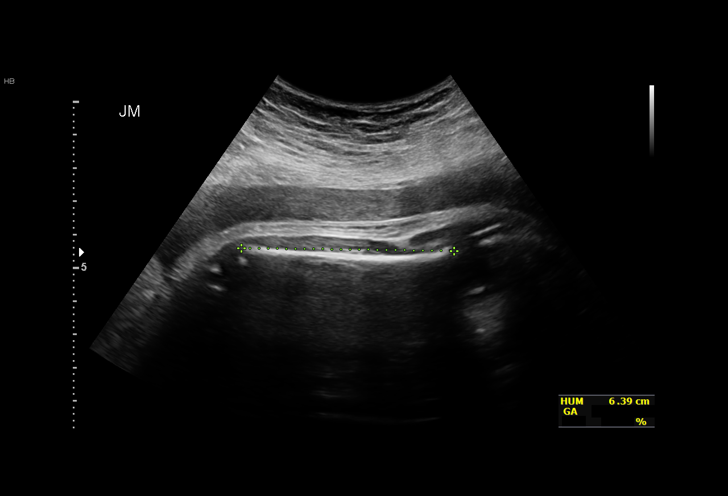
[im 10/38]
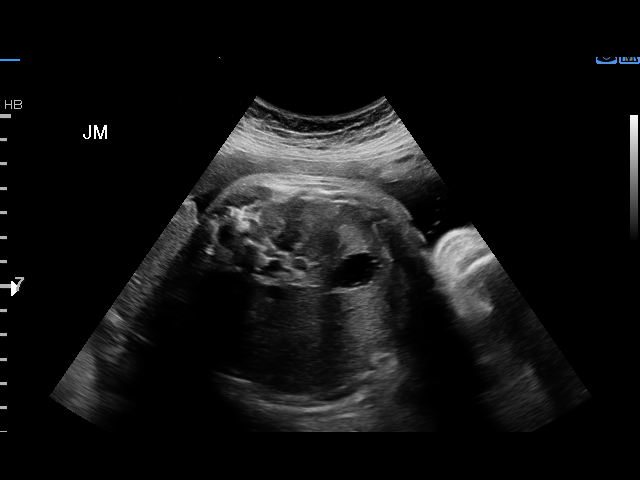
[im 13/38]
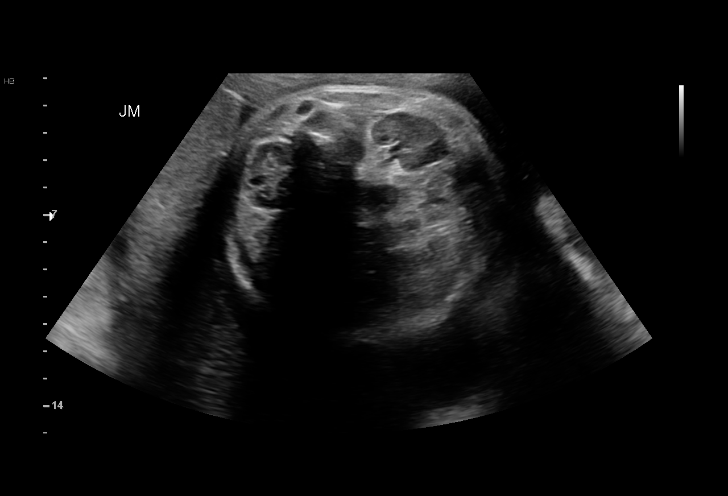
[im 16/38]
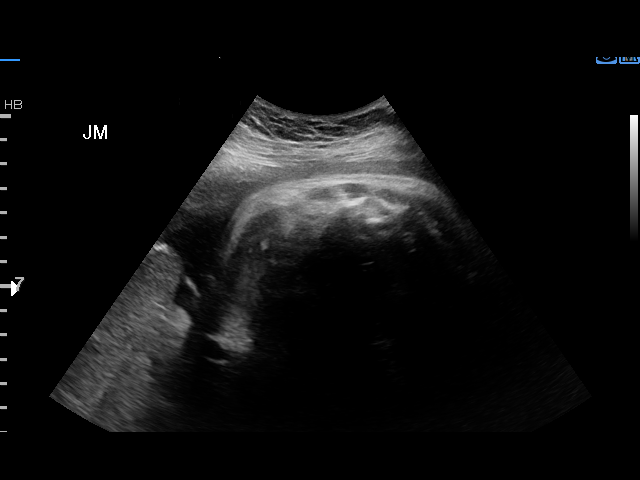
[im 18/38]
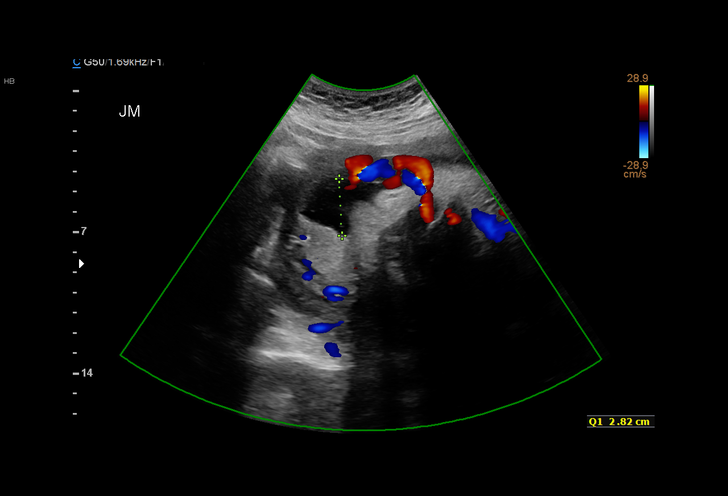
[im 21/38]
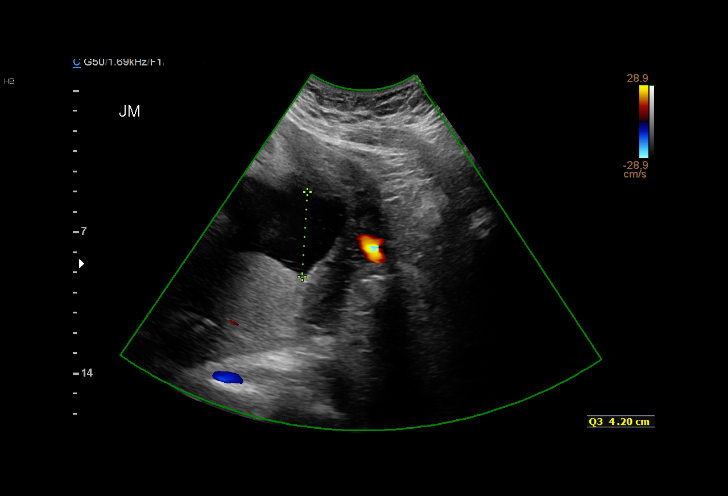
[im 24/38]
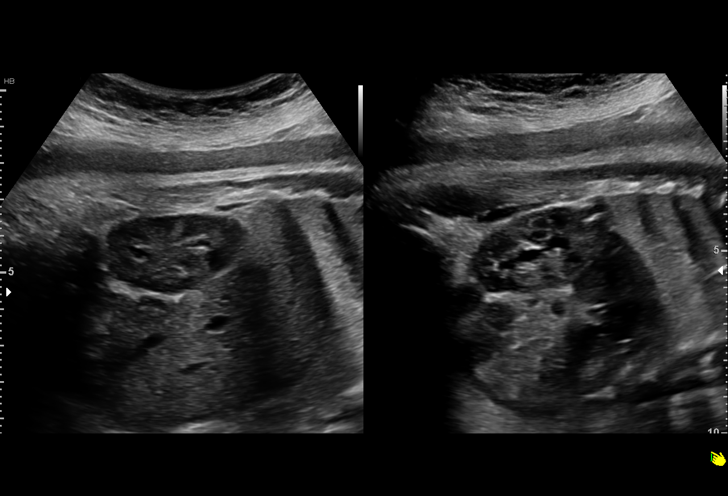
[im 27/38]
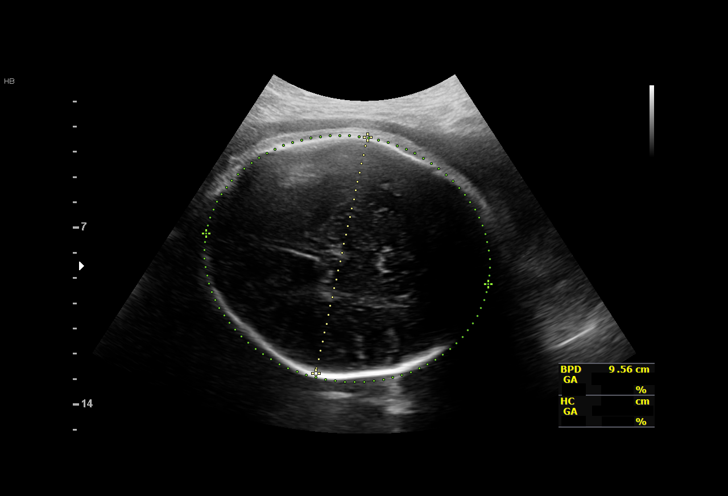
[im 29/38]
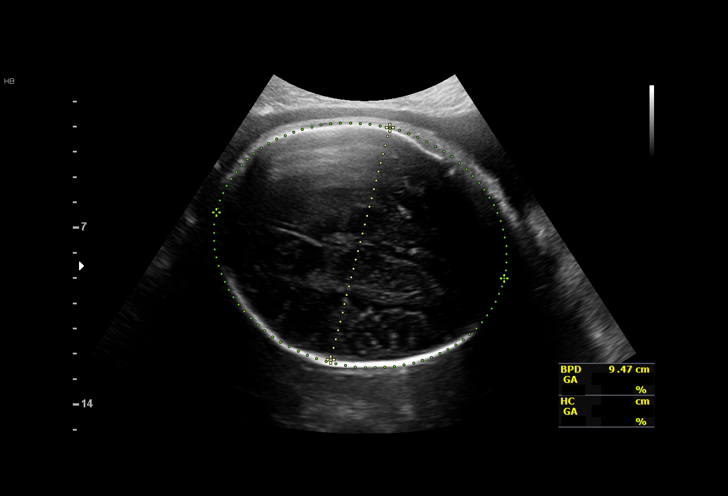
[im 32/38]
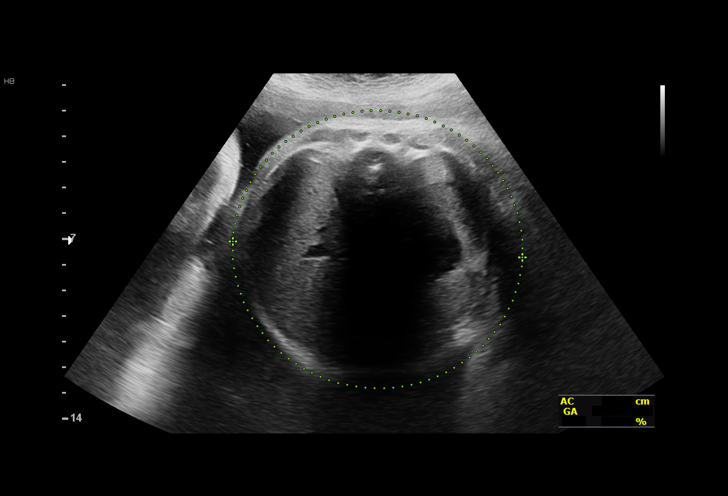
[im 35/38]
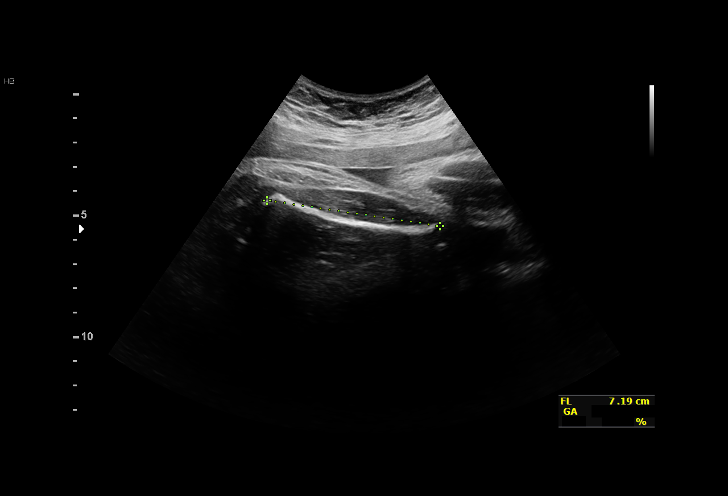
[im 38/38]
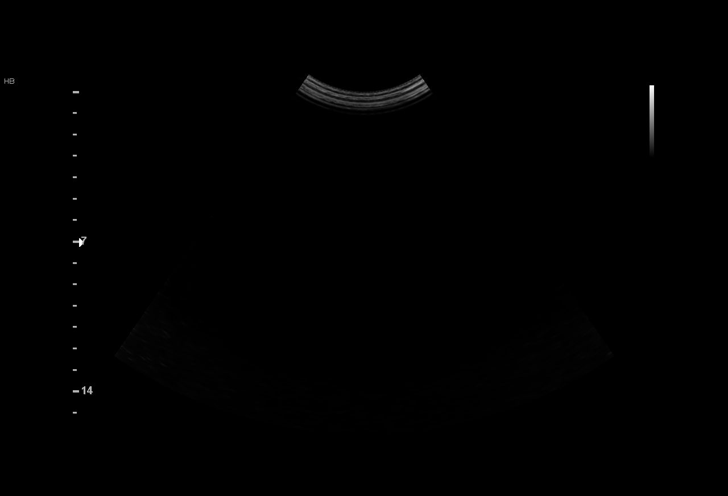

[14 of 28 positions shown; findings below may reference images not displayed]

----------------------------------------------------------------------

 ----------------------------------------------------------------------
Indications

  Late to prenatal care, third trimester
  Family history of congenital anomaly (child
  and sister with clubfeet)
  Advanced maternal age multigravida 35+,
  third trimester
  36 weeks gestation of pregnancy
 ----------------------------------------------------------------------
Vital Signs

 BMI:
Fetal Evaluation

 Num Of Fetuses:         1
 Fetal Heart Rate(bpm):  150
 Cardiac Activity:       Observed
 Presentation:           Cephalic
 Placenta:               Posterior
 P. Cord Insertion:      Previously Visualized

 Amniotic Fluid
 AFI FV:      Within normal limits

 AFI Sum(cm)     %Tile       Largest Pocket(cm)
 14.53           54

 RUQ(cm)       RLQ(cm)       LUQ(cm)        LLQ(cm)

Biophysical Evaluation

 Amniotic F.V:   Within normal limits       F. Tone:        Observed
 F. Movement:    Observed                   Score:          [DATE]
 F. Breathing:   Observed
Biometry

 BPD:      95.3  mm     G. Age:  38w 6d         97  %    CI:        81.24   %    70 - 86
                                                         FL/HC:      21.6   %    20.8 -
 HC:      333.8  mm     G. Age:  38w 1d         58  %    HC/AC:      0.97        0.92 -
 AC:      344.4  mm     G. Age:  38w 2d         93  %    FL/BPD:     75.7   %    71 - 87
 FL:       72.1  mm     G. Age:  37w 0d         53  %    FL/AC:      20.9   %    20 - 24
 HUM:      63.4  mm     G. Age:  36w 6d         69  %

 Est. FW:    9946  gm      7 lb 7 oz     89  %
OB History

 Gravidity:    4         Term:   3        Prem:   0        SAB:   0
 TOP:          0       Ectopic:  0        Living: 3
Gestational Age

 LMP:           35w 5d        Date:  12/22/17                 EDD:   09/28/18
 U/S Today:     38w 1d                                        EDD:   09/11/18
 Best:          36w 5d     Det. By:  Early Ultrasound         EDD:   09/21/18
                                     (02/21/18)
Anatomy

 Cranium:               Appears normal         Aortic Arch:            Previously seen
 Cavum:                 Appears normal         Ductal Arch:            Not well visualized
 Ventricles:            Appears normal         Diaphragm:              Previously seen
 Choroid Plexus:        Previously seen        Stomach:                Appears normal, left
                                                                       sided
 Cerebellum:            Previously seen        Abdomen:                Appears normal
 Posterior Fossa:       Previously seen        Abdominal Wall:         Not well visualized
 Nuchal Fold:           Not applicable (>20    Cord Vessels:           Previously seen
                        wks GA)
 Face:                  Orbits and profile     Kidneys:                Appear normal
                        previously seen
 Lips:                  Previously seen        Bladder:                Appears normal
 Thoracic:              Appears normal         Spine:                  Previously seen
 Heart:                 Previously seen        Upper Extremities:      Previously seen
 RVOT:                  Previously seen        Lower Extremities:      Previously seen
 LVOT:                  Appears normal

 Other:  Fetus appears to be a male. Heels visualized.
Cervix Uterus Adnexa

 Cervix
 Not visualized (advanced GA >78wks)
Impression

 Live late preterm singleton gestation with ultrasound
 biophysical profile score of [DATE].
Recommendations

 Due to advance maternal age, the following is recommended
 - Weekly NSTs as scheduled by OB provider
 - Weekly BPP evaluation with MFM starting from 09/09/2018
 - Delivery at 39 weeks gestational age.
                [place], [place]

## 2019-09-21 IMAGING — US US MFM FETAL BPP W/O NON-STRESS
1 series · 16 of 19 positions shown · non-contrast
Comparison: none

[Series 1: us mfm fetal bpp w/o non-stress · 19 acquisitions, 16 frames shown]
[im 1/19]
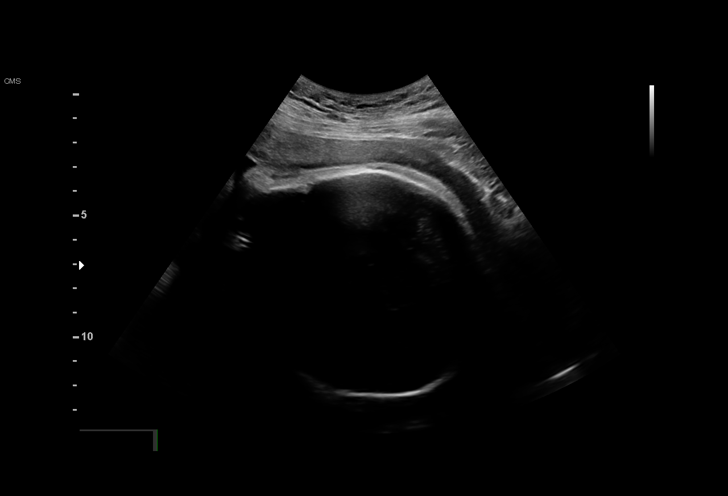
[im 2/19]
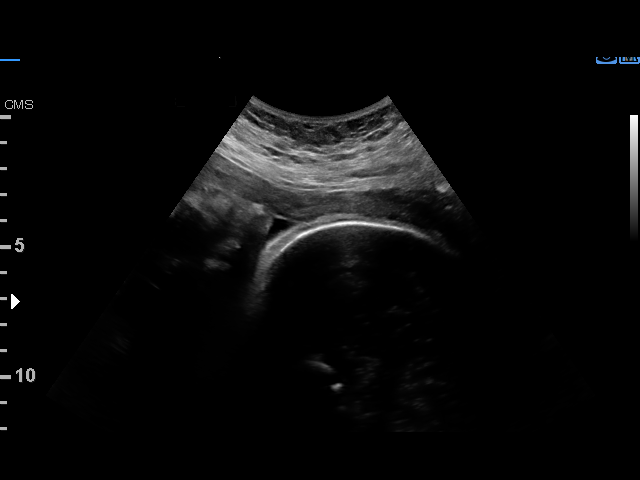
[im 3/19]
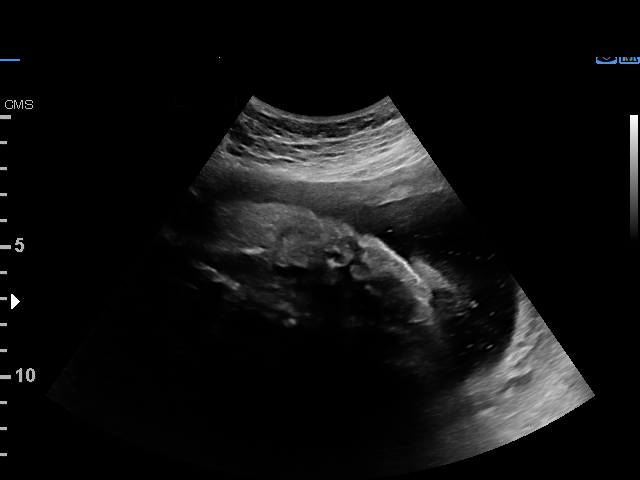
[im 5/19]
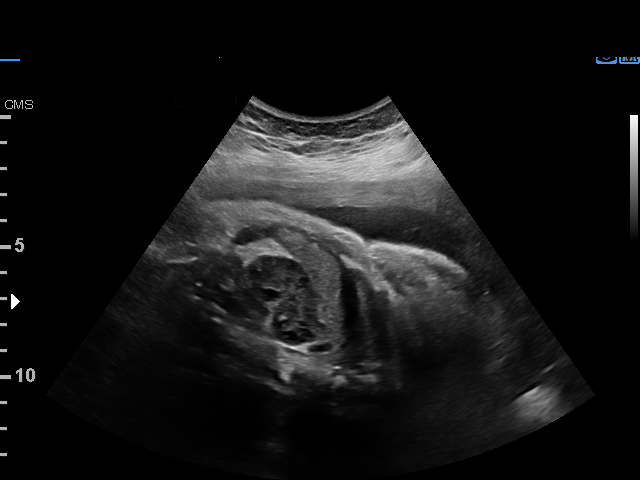
[im 6/19]
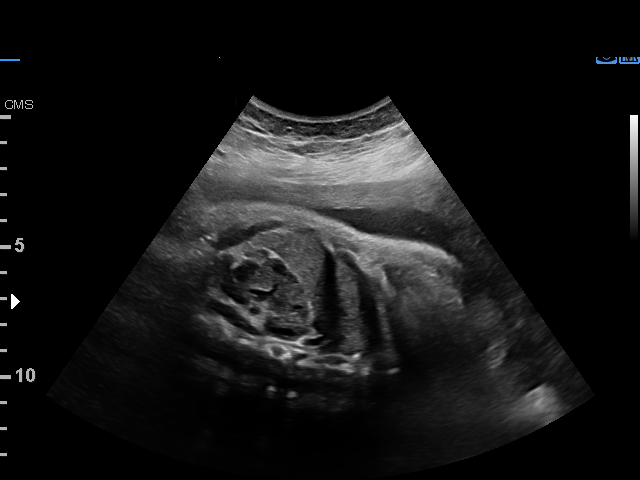
[im 7/19]
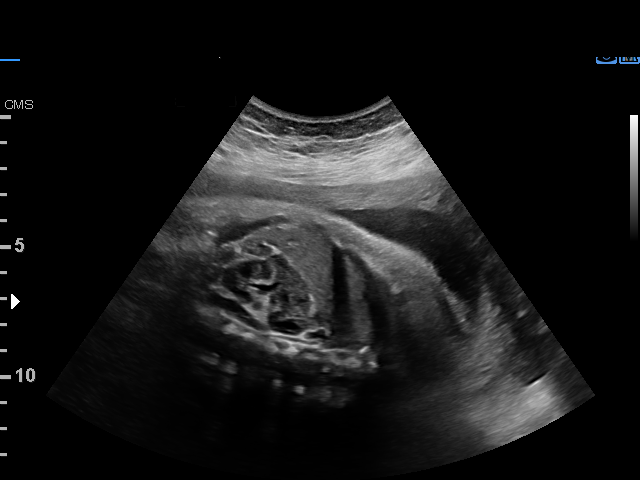
[im 8/19]
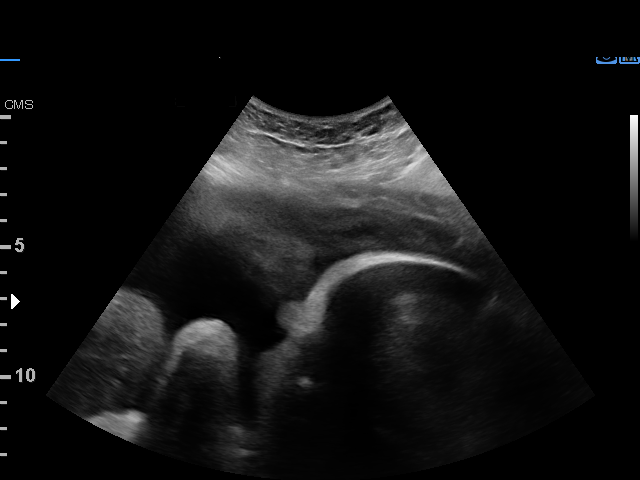
[im 9/19]
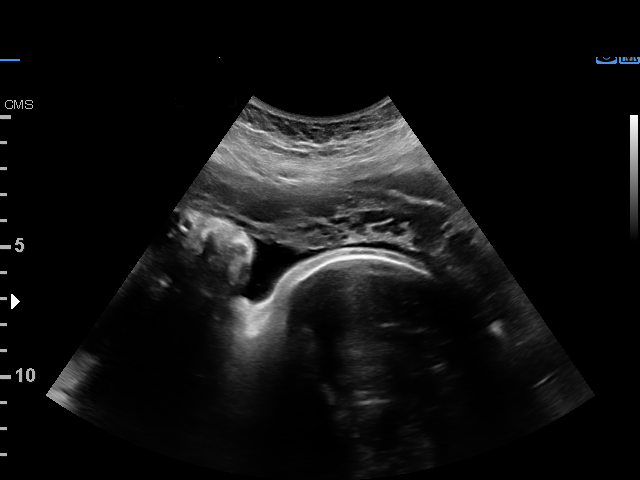
[im 11/19]
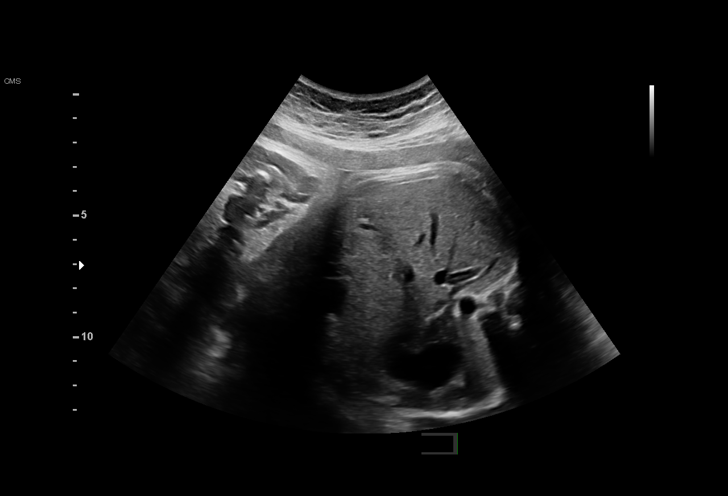
[im 12/19]
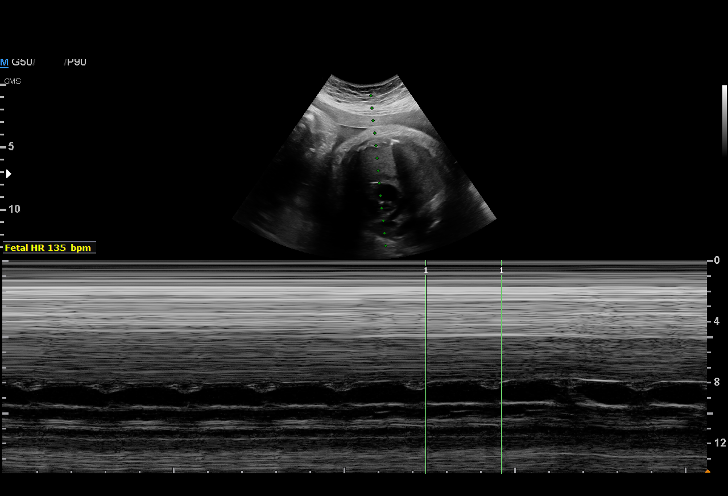
[im 13/19]
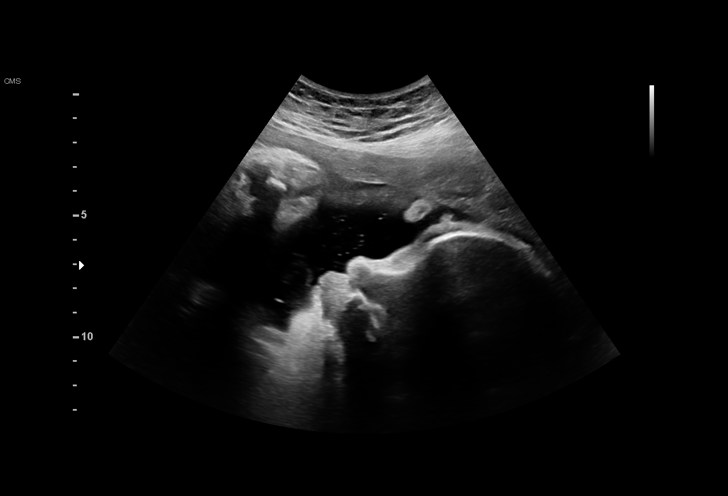
[im 14/19]
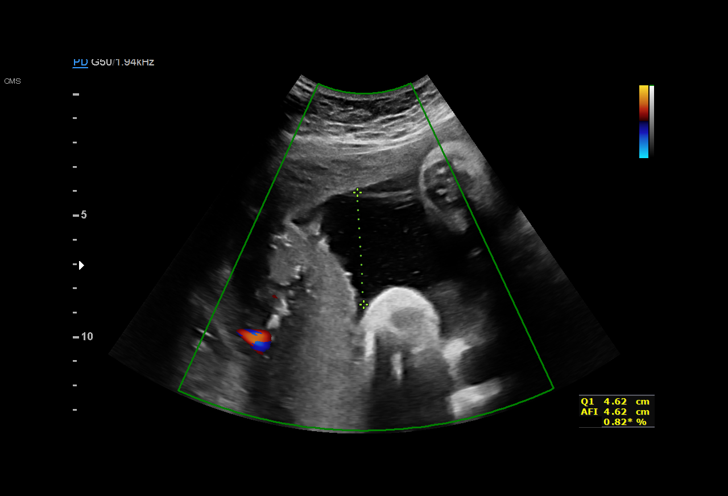
[im 15/19]
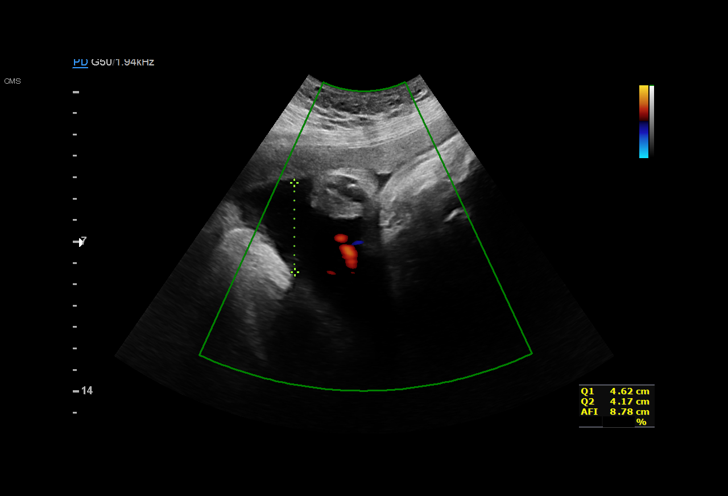
[im 17/19]
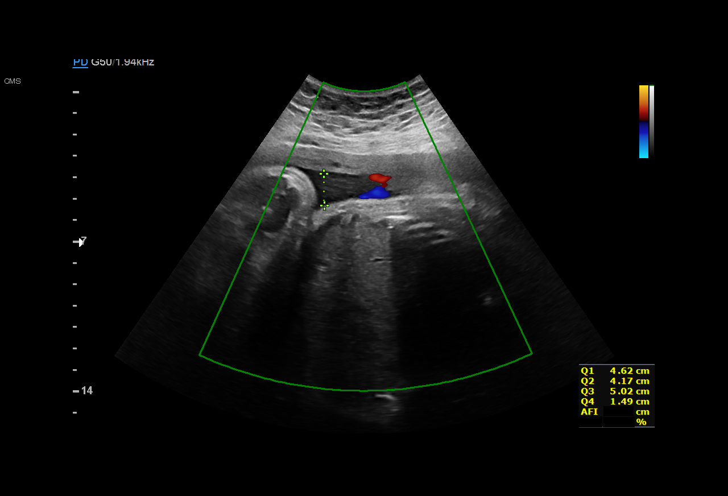
[im 18/19]
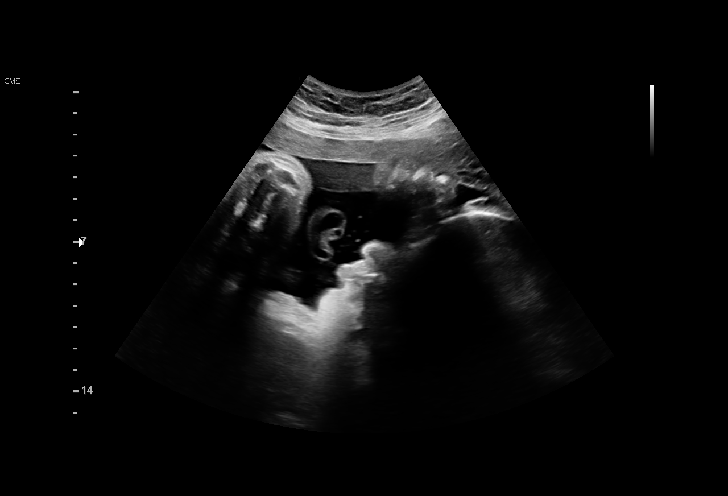
[im 19/19]
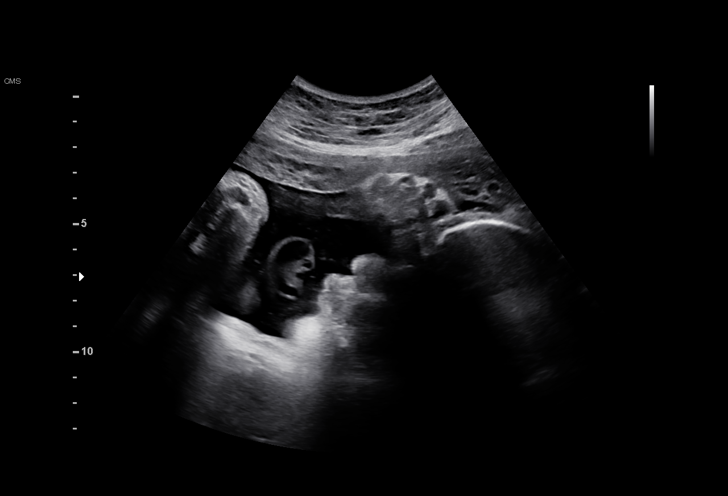

[16 of 19 positions shown; findings below may reference images not displayed]

----------------------------------------------------------------------

 ----------------------------------------------------------------------
Indications

  Late to prenatal care, third trimester
  Family history of congenital anomaly (child
  and sister with clubfeet)
  Advanced maternal age multigravida 35+,
  third trimester
  38 weeks gestation of pregnancy
  Gestational diabetes in pregnancy,
  controlled by oral hypoglycemic drugs
 ----------------------------------------------------------------------
Vital Signs

                                                Height:        5'6"
Fetal Evaluation

 Num Of Fetuses:          1
 Fetal Heart Rate(bpm):   135
 Cardiac Activity:        Observed
 Presentation:            Cephalic

 Amniotic Fluid
 AFI FV:      Within normal limits

 AFI Sum(cm)     %Tile       Largest Pocket(cm)
 15.3            60

 RUQ(cm)       RLQ(cm)       LUQ(cm)        LLQ(cm)

Biophysical Evaluation
 Amniotic F.V:   Within normal limits       F. Tone:         Observed
 F. Movement:    Observed                   Score:           [DATE]
 F. Breathing:   Observed
OB History

 Gravidity:    4         Term:   3        Prem:   0        SAB:   0
 TOP:          0       Ectopic:  0        Living: 3
Gestational Age

 LMP:           37w 2d        Date:  12/22/17                 EDD:   09/28/18
 Best:          38w 2d     Det. By:  Early Ultrasound         EDD:   09/21/18
                                     (02/21/18)
Impression

 Biophysical profile [DATE]
Recommendations

 Follow up as clinically indicated
 IOL scheduled [REDACTED] per patient.

## 2020-01-19 NOTE — Telephone Encounter (Signed)
error 

## 2021-01-09 ENCOUNTER — Other Ambulatory Visit: Payer: Self-pay | Admitting: Family Medicine

## 2021-01-09 DIAGNOSIS — Z1231 Encounter for screening mammogram for malignant neoplasm of breast: Secondary | ICD-10-CM

## 2021-02-17 ENCOUNTER — Ambulatory Visit
Admission: EM | Admit: 2021-02-17 | Discharge: 2021-02-17 | Disposition: A | Discharge status: Home / Self Care | Payer: 59 | Attending: Emergency Medicine | Admitting: Emergency Medicine

## 2021-02-17 ENCOUNTER — Other Ambulatory Visit: Payer: Self-pay

## 2021-02-17 ENCOUNTER — Encounter: Payer: Self-pay | Admitting: Emergency Medicine

## 2021-02-17 DIAGNOSIS — R197 Diarrhea, unspecified: Secondary | ICD-10-CM | POA: Diagnosis not present

## 2021-02-17 LAB — CBC WITH DIFFERENTIAL/PLATELET
Abs Immature Granulocytes: 0.06 10*3/uL (ref 0.00–0.07)
Basophils Absolute: 0 10*3/uL (ref 0.0–0.1)
Basophils Relative: 0 %
Eosinophils Absolute: 0 10*3/uL (ref 0.0–0.5)
Eosinophils Relative: 0 %
HCT: 39.4 % (ref 36.0–46.0)
Hemoglobin: 13.8 g/dL (ref 12.0–15.0)
Immature Granulocytes: 1 %
Lymphocytes Relative: 13 %
Lymphs Abs: 1.5 10*3/uL (ref 0.7–4.0)
MCH: 31.5 pg (ref 26.0–34.0)
MCHC: 35 g/dL (ref 30.0–36.0)
MCV: 90 fL (ref 80.0–100.0)
Monocytes Absolute: 0.5 10*3/uL (ref 0.1–1.0)
Monocytes Relative: 4 %
Neutro Abs: 9.5 10*3/uL — ABNORMAL HIGH (ref 1.7–7.7)
Neutrophils Relative %: 82 %
Platelets: 277 10*3/uL (ref 150–400)
RBC: 4.38 MIL/uL (ref 3.87–5.11)
RDW: 12.9 % (ref 11.5–15.5)
WBC: 11.6 10*3/uL — ABNORMAL HIGH (ref 4.0–10.5)
nRBC: 0 % (ref 0.0–0.2)

## 2021-02-17 LAB — COMPREHENSIVE METABOLIC PANEL
ALT: 27 U/L (ref 0–44)
AST: 24 U/L (ref 15–41)
Albumin: 3.6 g/dL (ref 3.5–5.0)
Alkaline Phosphatase: 86 U/L (ref 38–126)
Anion gap: 2 — ABNORMAL LOW (ref 5–15)
BUN: 6 mg/dL (ref 6–20)
CO2: 24 mmol/L (ref 22–32)
Calcium: 8.5 mg/dL — ABNORMAL LOW (ref 8.9–10.3)
Chloride: 107 mmol/L (ref 98–111)
Creatinine, Ser: 0.81 mg/dL (ref 0.44–1.00)
GFR, Estimated: >60 mL/min (ref >60)
Glucose, Bld: 103 mg/dL — ABNORMAL HIGH (ref 70–99)
Potassium: 3.5 mmol/L (ref 3.5–5.1)
Sodium: 133 mmol/L — ABNORMAL LOW (ref 135–145)
Total Bilirubin: 0.8 mg/dL (ref 0.3–1.2)
Total Protein: 6.9 g/dL (ref 6.5–8.1)

## 2021-02-17 MED ORDER — SODIUM CHLORIDE 0.9 % IV BOLUS
1000.0000 mL | Freq: Once | INTRAVENOUS | Status: AC
  Administered 2021-02-17: 1000 mL via INTRAVENOUS

## 2021-02-17 MED ORDER — VANCOMYCIN HCL 125 MG PO CAPS: 125.0000 mg | ORAL_CAPSULE | Freq: Four times a day (QID) | ORAL | 0 refills | Status: AC

## 2021-02-17 NOTE — ED Triage Notes (Signed)
Patient c/o bloody diarrhea, abdominal pain and fever that started this morning.  Patient reports she has been on an antibiotic since last Monday.

## 2021-02-17 NOTE — Discharge Instructions (Addendum)
Stop the Augmentin.  Collect a stool specimen and return them to the clinic so we can analyze for the presence of C. difficile or other gastrointestinal pathogens.  Fill the vancomycin prescription tomorrow and start taking it, 125 mg 4 times a day for 10 days, for presumptive C. difficile diarrhea.  If you are unable to take in fluids by mouth to keep up with the amount of fluid you are losing through your stool return for reevaluation or go to the emergency department for IV hydration and possible admission.  You can use over-the-counter Tylenol as needed for fever.

## 2021-02-17 NOTE — ED Provider Notes (Signed)
[facility]    CSN: [id number] Arrival date & time: 02/17/21  1734      History   Chief Complaint Chief Complaint  Patient presents with  . Abdominal Pain  . Diarrhea    HPI Miranda Herrera is a 45 y.o. female.   HPI   45 year old female here for evaluation of abdominal pain, bloody diarrhea, and fever.  Patient reports that her symptoms started 2-day.  She has had too numerous to count bloody diarrhea stools with 5-6 of those being large volume.  She is also complaining of feeling dizzy and weak.  She denies nausea, vomiting, or passing out.  Patient has been on Augmentin for sinus infection for the past 7 days.  Past Medical History:  Diagnosis Date  . Depression    PPD with 05/23/2017 delivery, wellbutrin  . GDM (gestational diabetes mellitus) 07/21/2018   Diagnosed on 10/29. Declining IOL at 39 weeks, wants to 'wait until baby is ready to come on his own', see 12/3 note. Normal postpartum 2 hr GTT>  . Gestational diabetes   . Headache(784.0)   . History of precipitous delivery 05/23/2017  . Hx of varicella   . 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 BMI 40.0-44.9, adult (HCC) 06/28/2018    Past Surgical History:  Procedure Laterality Date  . EYE SURGERY     lasik  . TUBAL LIGATION N/A 09/16/2018   Procedure: POST PARTUM TUBAL LIGATION;  Surgeon: Tereso Newcomer, MD;  Location: [facility];  Service: Gynecology;  Laterality: N/A;    OB History    Gravida  4   Para  4   Term  4   Preterm  0   AB  0   Living  4     SAB  0   IAB  0   Ectopic  0   Multiple  0   Live Births  4            Home Medications    Prior to Admission medications   Medication Sig Start Date End Date Taking? Authorizing Provider  vancomycin (VANCOCIN) 125 MG capsule Take 1 capsule (125 mg total) by mouth 4 (four) times daily for 10 days. 02/17/21 02/27/21 Yes Becky Augusta, NP  ACCU-CHEK FASTCLIX LANCETS MISC 1 each by Percutaneous route 4 (four) times daily. 07/26/18   Anyanwu, Jethro Bastos, MD  ferrous sulfate 325 (65 FE) MG tablet Take 1 tablet (325 mg total) by mouth 2 (two) times daily with a meal. Patient not taking: No sig reported 09/17/18 02/17/21  Henderson Newcomer, MD  simethicone (MYLICON) 80 MG chewable tablet Chew 1 tablet (80 mg total) by mouth as needed for flatulence. Patient not taking: No sig reported 09/17/18 02/17/21  Henderson Newcomer, MD    Family History Family History  Problem Relation Age of Onset  . Diabetes Sister   . Birth defects Sister        clubbed feet  . Diabetes Maternal Uncle   . Diabetes Paternal Grandmother   . Cancer Paternal Grandmother        ovarian  . Healthy Mother   . Healthy Father     Social History Social History   Tobacco Use  . Smoking status: Never Smoker  . Smokeless tobacco: Never Used  Vaping Use  . Vaping Use: Never used  Substance Use Topics  . Alcohol  use: No  . Drug use: No     Allergies   Patient has no known allergies.   Review of Systems Review of Systems  Constitutional: Positive for fever. Negative for activity change and appetite change.  Gastrointestinal: Positive for abdominal pain and blood in stool. Negative for nausea and vomiting.  Neurological: Positive for dizziness. Negative for syncope.  Hematological: Negative.   Psychiatric/Behavioral: Negative.      Physical Exam Triage Vital Signs ED Triage Vitals  Enc Vitals Group     BP 02/17/21 1743 112/68     Pulse Rate 02/17/21 1743 (!) 120     Resp 02/17/21 1743 18     Temp 02/17/21 1743 (!) 101.1 F (38.4 C)     Temp Source 02/17/21 1743 Oral     SpO2 --      Weight 02/17/21 1743 249 lb 1.9 oz (113 kg)     Height 02/17/21 1743 5\' 6"  (1.676 m)     Head Circumference --      Peak Flow --      Pain Score 02/17/21 1742 7     Pain Loc --      Pain Edu? --      Excl. in GC? --    No data  found.  Updated Vital Signs BP 112/68 (BP Location: Left Arm)   Pulse (!) 120   Temp (!) 101.1 F (38.4 C) (Oral)   Resp 18   Ht 5\' 6"  (1.676 m)   Wt 249 lb 1.9 oz (113 kg)   LMP 01/20/2021   BMI 40.21 kg/m   Visual Acuity Right Eye Distance:   Left Eye Distance:   Bilateral Distance:    Right Eye Near:   Left Eye Near:    Bilateral Near:     Physical Exam Vitals and nursing note reviewed.  Constitutional:      General: She is not in acute distress.    Appearance: She is well-developed. She is not ill-appearing.  HENT:     Head: Normocephalic and atraumatic.  Cardiovascular:     Rate and Rhythm: Normal rate and regular rhythm.     Heart sounds: Normal heart sounds. No murmur heard. No gallop.   Pulmonary:     Effort: Pulmonary effort is normal.     Breath sounds: Normal breath sounds. No wheezing, rhonchi or rales.  Abdominal:     General: Abdomen is protuberant. Bowel sounds are normal. There is no distension.     Palpations: Abdomen is soft. There is no hepatomegaly or splenomegaly.     Tenderness: There is abdominal tenderness in the right lower quadrant and left lower quadrant. There is no guarding or rebound.     Hernia: A hernia is present. Hernia is present in the ventral area.  Skin:    General: Skin is warm and dry.     Capillary Refill: Capillary refill takes less than 2 seconds.     Findings: No erythema.  Neurological:     General: No focal deficit present.     Mental Status: She is alert and oriented to person, place, and time.  Psychiatric:        Mood and Affect: Mood normal.        Behavior: Behavior normal.      UC Treatments / Results  Labs (all labs ordered are listed, but only abnormal results are displayed) Labs Reviewed  CBC WITH DIFFERENTIAL/PLATELET - Abnormal; Notable for the following components:      Result  Value   WBC 11.6 (*)    Neutro Abs 9.5 (*)    All other components within normal limits  COMPREHENSIVE METABOLIC PANEL -  Abnormal; Notable for the following components:   Sodium 133 (*)    Glucose, Bld 103 (*)    Calcium 8.5 (*)    Anion gap 2 (*)    All other components within normal limits    EKG   Radiology No results found.  Procedures Procedures (including critical care time)  Medications Ordered in UC Medications  sodium chloride 0.9 % bolus 1,000 mL (1,000 mLs Intravenous New Bag/Given 02/17/21 1810)    Initial Impression / Assessment and Plan / UC Course  I have reviewed the triage vital signs and the nursing notes.  Pertinent labs & imaging results that were available during my care of the patient were reviewed by me and considered in my medical decision making (see chart for details).   Patient is a very pleasant 45 year old female here for evaluation of bloody diarrhea with associate abdominal pain and fever that started this morning.  She has been on Augmentin for the past 7 days for treatment of a sinus infection.  She reports that she is had too numerous to count diarrhea stools today with 5-6 of large-volume.  She is also felt dizzy and weak but has not had any syncopal events.  No nausea or vomiting.  Physical exam reveals a benign cardiopulmonary exam.  Membranes are dry and sticky and ocular sclera is dull.  Abdomen is protuberant but soft with positive bowel sounds all 4 quadrants.  Patient does have mild tenderness in bilateral lower quadrants without guarding or rebound.  Concerned that patient has developed C. difficile after being on Augmentin for the past 7 days.  Will check C. difficile and stool pathogens panel along with CBC and CMP.  We will also give patient 1 L NS to help replace fluid volume that has been lost.  Patient vies that if her white blood cell count is not elevated and her renal function is normal we can try treating her at home with cessation of the Augmentin.  If her renal function is abnormal and her white blood cell count is high she may very well need to be  evaluated in the ER.  CBC reflects an elevated white blood cell count of 11.6 with elevated neutrophil count of 9.5 and is otherwise unremarkable.  CMP shows mild hyponatremia with a sodium of 133 and hypocalcemia with a calcium of 8.5.  BUN is 16 creatinine is 0.81.  Transaminases are unremarkable.  Will finish patient's IV fluids and discharge her home on vancomycin 125 mg p.o. every 6 hours x10 days.   Final Clinical Impressions(s) / UC Diagnoses   Final diagnoses:  Bloody diarrhea     Discharge Instructions     Stop the Augmentin.  Collect a stool specimen and return them to the clinic so we can analyze for the presence of C. difficile or other gastrointestinal pathogens.  Fill the vancomycin prescription tomorrow and start taking it, 125 mg 4 times a day for 10 days, for presumptive C. difficile diarrhea.  If you are unable to take in fluids by mouth to keep up with the amount of fluid you are losing through your stool return for reevaluation or go to the emergency department for IV hydration and possible admission.  You can use over-the-counter Tylenol as needed for fever.    ED Prescriptions    Medication Sig Dispense  Auth. Provider   vancomycin (VANCOCIN) 125 MG capsule Take 1 capsule (125 mg total) by mouth 4 (four) times daily for 10 days. 40 capsule Becky Augusta, NP     PDMP not reviewed this encounter.   Becky Augusta, NP 02/17/21 1904

## 2021-02-18 ENCOUNTER — Other Ambulatory Visit
Admission: RE | Admit: 2021-02-18 | Discharge: 2021-02-18 | Disposition: A | Discharge status: Home / Self Care | Payer: 59 | Source: Ambulatory Visit | Attending: Emergency Medicine | Admitting: Emergency Medicine

## 2021-02-18 ENCOUNTER — Telehealth: Payer: Self-pay

## 2021-02-18 DIAGNOSIS — R197 Diarrhea, unspecified: Secondary | ICD-10-CM | POA: Diagnosis present

## 2021-02-18 DIAGNOSIS — K921 Melena: Secondary | ICD-10-CM | POA: Diagnosis not present

## 2021-02-18 LAB — GASTROINTESTINAL PANEL BY PCR, STOOL (REPLACES STOOL CULTURE)

## 2021-02-18 LAB — C DIFFICILE QUICK SCREEN W PCR REFLEX
C Diff antigen: POSITIVE — AB
C Diff interpretation: DETECTED
C Diff toxin: POSITIVE — AB

## 2021-02-20 ENCOUNTER — Ambulatory Visit
Admission: RE | Admit: 2021-02-20 | Discharge: 2021-02-20 | Disposition: A | Discharge status: Home / Self Care | Payer: 59 | Source: Ambulatory Visit | Attending: Family Medicine | Admitting: Family Medicine

## 2021-02-20 ENCOUNTER — Other Ambulatory Visit: Payer: Self-pay

## 2021-02-20 DIAGNOSIS — Z1231 Encounter for screening mammogram for malignant neoplasm of breast: Secondary | ICD-10-CM | POA: Diagnosis present

## 2021-12-24 ENCOUNTER — Ambulatory Visit
Admission: EM | Admit: 2021-12-24 | Discharge: 2021-12-24 | Disposition: A | Discharge status: Home / Self Care | Payer: Managed Care, Other (non HMO) | Attending: Physician Assistant | Admitting: Physician Assistant

## 2021-12-24 ENCOUNTER — Other Ambulatory Visit: Payer: Self-pay

## 2021-12-24 DIAGNOSIS — J0100 Acute maxillary sinusitis, unspecified: Secondary | ICD-10-CM

## 2021-12-24 DIAGNOSIS — R0981 Nasal congestion: Secondary | ICD-10-CM | POA: Diagnosis not present

## 2021-12-24 MED ORDER — DOXYCYCLINE HYCLATE 100 MG PO CAPS: 100.0000 mg | ORAL_CAPSULE | Freq: Two times a day (BID) | ORAL | 0 refills | Status: AC

## 2021-12-24 NOTE — ED Provider Notes (Signed)
?[facility] ? ? ? ?CSN: [id number] ?Arrival date & time: 12/24/21  1932 ? ? ?  ? ?History   ?Chief Complaint ?Chief Complaint  ?Patient presents with  ? Cough  ? Facial Pain  ? ? ?HPI ?Miranda Herrera is a 46 y.o. female presenting for 1 week history of nasal congestion, sinus pressure/pain and cough.  Patient reports over the past 2 to 3 days she has developed left-sided facial pain and swelling along with discolored yellowish-green drainage from the left nostril only.  Patient reports both of her kids recently were diagnosed with sinus infections are on the seventh day of their antibiotics and feeling better.  She denies any associated fevers or fatigue.  No breathing difficulty.  Has been taking over-the-counter Allegra and using Flonase without improvement in symptoms.  Patient believes she may have a sinus infection at this point.  No other complaints. ? ?HPI ? ?Past Medical History:  ?Diagnosis Date  ? Depression   ? PPD with 05/23/2017 delivery, wellbutrin  ? GDM (gestational diabetes mellitus) 07/21/2018  ? Diagnosed on 10/29. Declining IOL at 39 weeks, wants to 'wait until baby is ready to come on his own', see 12/3 note. Normal postpartum 2 hr GTT>  ? Gestational diabetes   ? Headache(784.0)   ? History of precipitous delivery 05/23/2017  ? Hx of varicella   ? 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 Negative 06/2018 anatomy u/s  ? ? ?Patient Active Problem List  ? Diagnosis Date Noted  ? BMI 40.0-44.9, adult (HCC) 06/28/2018  ? ? ?Past Surgical History:  ?Procedure Laterality Date  ? EYE SURGERY    ? lasik  ? TUBAL LIGATION N/A 09/16/2018  ? Procedure: POST PARTUM TUBAL LIGATION;  Surgeon: Tereso Newcomer, MD;  Location: [facility];  Service: Gynecology;  Laterality: N/A;  ? ? ?OB History   ? ? Gravida  ?4  ? Para  ?4  ? Term  ?4  ? Preterm  ?0  ? AB  ?0  ? Living  ?4  ?  ? ? SAB  ?0  ? IAB  ?0  ? Ectopic  ?0  ? Multiple  ?0  ? Live Births  ?4  ?   ?  ?  ? ? ? ?Home Medications   ? ?Prior to  Admission medications   ?Medication Sig Start Date End Date Taking? Authorizing Provider  ?ACCU-CHEK FASTCLIX LANCETS MISC 1 each by Percutaneous route 4 (four) times daily. 07/26/18  Yes Anyanwu, Jethro Bastos, MD  ?doxycycline (VIBRAMYCIN) 100 MG capsule Take 1 capsule (100 mg total) by mouth 2 (two) times daily for 7 days. 12/24/21 12/31/21 Yes Eusebio Friendly B, PA-C  ?ferrous sulfate 325 (65 FE) MG tablet Take 1 tablet (325 mg total) by mouth 2 (two) times daily with a meal. ?Patient not taking: No sig reported 09/17/18 02/17/21  Henderson Newcomer, MD  ?simethicone (MYLICON) 80 MG chewable tablet Chew 1 tablet (80 mg total) by mouth as needed for flatulence. ?Patient not taking: No sig reported 09/17/18 02/17/21  Henderson Newcomer, MD  ? ? ?Family History ?Family History  ?Problem Relation Age of Onset  ? Diabetes Sister   ? Birth defects Sister   ?     clubbed feet  ? Diabetes Maternal Uncle   ? Diabetes Paternal Grandmother   ? Cancer Paternal Grandmother   ?     ovarian  ? Healthy Mother   ? Healthy Father   ? Breast cancer  Neg Hx   ? ? ?Social History ?Social History  ? ?Tobacco Use  ? Smoking status: Never  ? Smokeless tobacco: Never  ?Vaping Use  ? Vaping Use: Never used  ?Substance Use Topics  ? Alcohol use: No  ? Drug use: No  ? ? ? ?Allergies   ?Patient has no known allergies. ? ? ?Review of Systems ?Review of Systems  ?Constitutional:  Negative for chills, diaphoresis, fatigue and fever.  ?HENT:  Positive for congestion, postnasal drip, rhinorrhea, sinus pressure and sinus pain. Negative for ear pain and sore throat.   ?Respiratory:  Positive for cough. Negative for shortness of breath.   ?Gastrointestinal:  Negative for abdominal pain, nausea and vomiting.  ?Musculoskeletal:  Negative for arthralgias and myalgias.  ?Skin:  Negative for rash.  ?Neurological:  Negative for weakness and headaches.  ?Hematological:  Negative for adenopathy.  ? ? ?Physical Exam ?Triage Vital Signs ?ED Triage Vitals  ?Enc Vitals Group   ?   BP 12/24/21 1946 116/60  ?   Pulse Rate 12/24/21 1946 92  ?   Resp --   ?   Temp 12/24/21 1946 98.4 ?F (36.9 ?C)  ?   Temp Source 12/24/21 1946 Oral  ?   SpO2 12/24/21 1946 97 %  ?   Weight 12/24/21 1944 240 lb (108.9 kg)  ?   Height 12/24/21 1944 5\' 5"  (1.651 m)  ?   Head Circumference --   ?   Peak Flow --   ?   Pain Score 12/24/21 1944 3  ?   Pain Loc --   ?   Pain Edu? --   ?   Excl. in GC? --   ? ?No data found. ? ?Updated Vital Signs ?BP 116/60 (BP Location: Left Arm)   Pulse 92   Temp 98.4 ?F (36.9 ?C) (Oral)   Ht 5\' 5"  (1.651 m)   Wt 240 lb (108.9 kg)   SpO2 97%   BMI 39.94 kg/m?  ?   ? ?Physical Exam ?Vitals and nursing note reviewed.  ?Constitutional:   ?   General: She is not in acute distress. ?   Appearance: Normal appearance. She is ill-appearing. She is not toxic-appearing.  ?HENT:  ?   Head: Normocephalic and atraumatic.  ?   Comments: Mild swelling over left maxillary region w/o erythema/warmth. TTP left maxillary sinus ?   Nose: Congestion present.  ?   Mouth/Throat:  ?   Mouth: Mucous membranes are moist.  ?   Pharynx: Oropharynx is clear.  ?Eyes:  ?   General: No scleral icterus.    ?   Right eye: No discharge.     ?   Left eye: No discharge.  ?   Conjunctiva/sclera: Conjunctivae normal.  ?Cardiovascular:  ?   Rate and Rhythm: Normal rate and regular rhythm.  ?   Heart sounds: Normal heart sounds.  ?Pulmonary:  ?   Effort: Pulmonary effort is normal. No respiratory distress.  ?   Breath sounds: Normal breath sounds.  ?Musculoskeletal:  ?   Cervical back: Neck supple.  ?Skin: ?   General: Skin is dry.  ?Neurological:  ?   General: No focal deficit present.  ?   Mental Status: She is alert. Mental status is at baseline.  ?   Motor: No weakness.  ?   Gait: Gait normal.  ?Psychiatric:     ?   Mood and Affect: Mood normal.     ?   Behavior: Behavior normal.     ?  Thought Content: Thought content normal.  ? ? ? ?UC Treatments / Results  ?Labs ?(all labs ordered are listed, but only  abnormal results are displayed) ?Labs Reviewed - No data to display ? ?EKG ? ? ?Radiology ?No results found. ? ?Procedures ?Procedures (including critical care time) ? ?Medications Ordered in UC ?Medications - No data to display ? ?Initial Impression / Assessment and Plan / UC Course  ?I have reviewed the triage vital signs and the nursing notes. ? ?Pertinent labs & imaging results that were available during my care of the patient were reviewed by me and considered in my medical decision making (see chart for details). ? ?46 year old female presenting presenting for 1 week history of nasal congestion, sinus pressure/pain and cough.  Reports discolored yellowish-green drainage from the left nostril only i along with left maxillary sinus pain over the past couple of days.  Patient has been trying Allegra and Flonase without improvement in symptoms.  Patient's vital signs normal and stable.  She is mildly ill-appearing but nontoxic.  On exam she does have nasal congestion and mucosal edema, mild swelling over the left maxillary sinus with tenderness to palpation of this area.  No overlying erythema or warmth.  Suspect patient likely does have secondary bacterial sinusitis.  She is concerned about taking antibiotics and she has a history of C. difficile last year.  I looked into her medical record it looks like she was prescribed Augmentin and then developed C. difficile.  Advised her that she could get seeded from any antibiotic that she takes and if she wants to wait a few more days to see if her symptoms get better before starting antibiotics she can do so.  She says she does not want a wait.  Sent doxycycline at this time and advised her to follow-up if she develops any symptoms of C. difficile or worsening URI symptoms.  Advised switching to Allegra-D and continuing Flonase and increasing rest and fluids.  Follow-up as needed. ? ? ?Final Clinical Impressions(s) / UC Diagnoses  ? ?Final diagnoses:  ?Acute maxillary  sinusitis, recurrence not specified  ?Nasal congestion  ? ? ? ?Discharge Instructions   ? ?  ?-Switch to Allegra-D.  Continue Flonase.  I have sent antibiotics pharmacy and as we discussed any antibiotic has the ab

## 2021-12-24 NOTE — ED Triage Notes (Signed)
Patient c.o coughing, coughing up green mucus and had sinus pressure. Patient states she has been taking Flonase with no relief. Symptoms started last Wednesday. Kids are fighting a sinus infection and she thinks she has it now. Patient stated the last time she took an antibiotic she ended up with CDIFF.  ?

## 2021-12-24 NOTE — Discharge Instructions (Addendum)
-  Switch to Allegra-D.  Continue Flonase.  I have sent antibiotics pharmacy and as we discussed any antibiotic has the ability to cause you C. difficile.  If you have any symptoms of C. difficile you should be seen again. ?

## 2022-03-04 IMAGING — MG MM DIGITAL SCREENING BILAT W/ TOMO AND CAD
8 series · 8 of 24 positions shown · non-contrast
Comparison: None.

CLINICAL DATA: Screening.

EXAM:
DIGITAL SCREENING BILATERAL MAMMOGRAM WITH TOMOSYNTHESIS AND CAD
TECHNIQUE: Bilateral screening digital craniocaudal and mediolateral oblique
mammograms were obtained. Bilateral screening digital breast
tomosynthesis was performed. The images were evaluated with
computer-aided detection.

[L CC synth-2D]
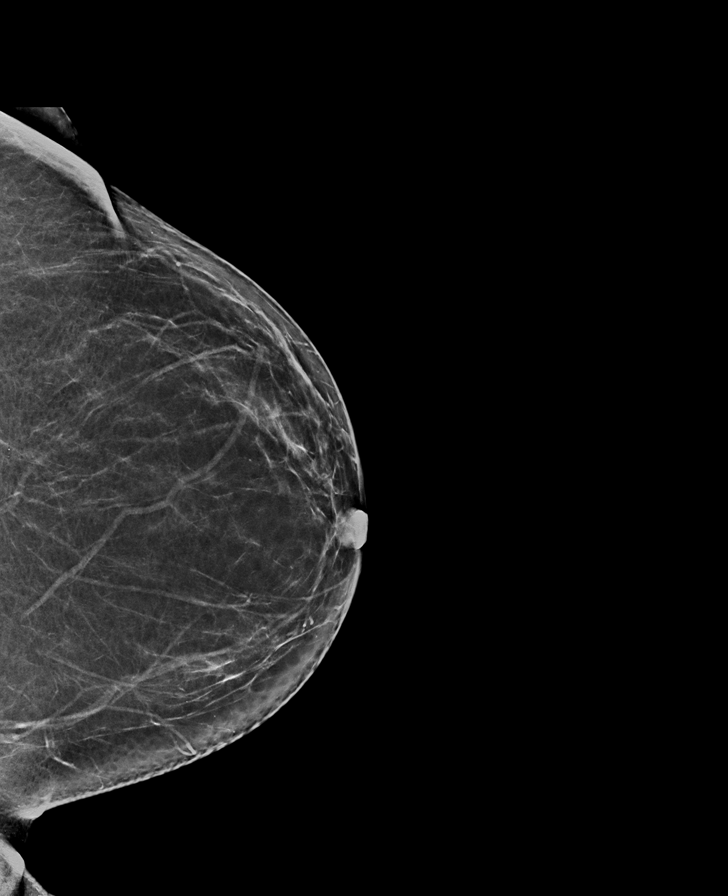

[R MLO synth-2D]
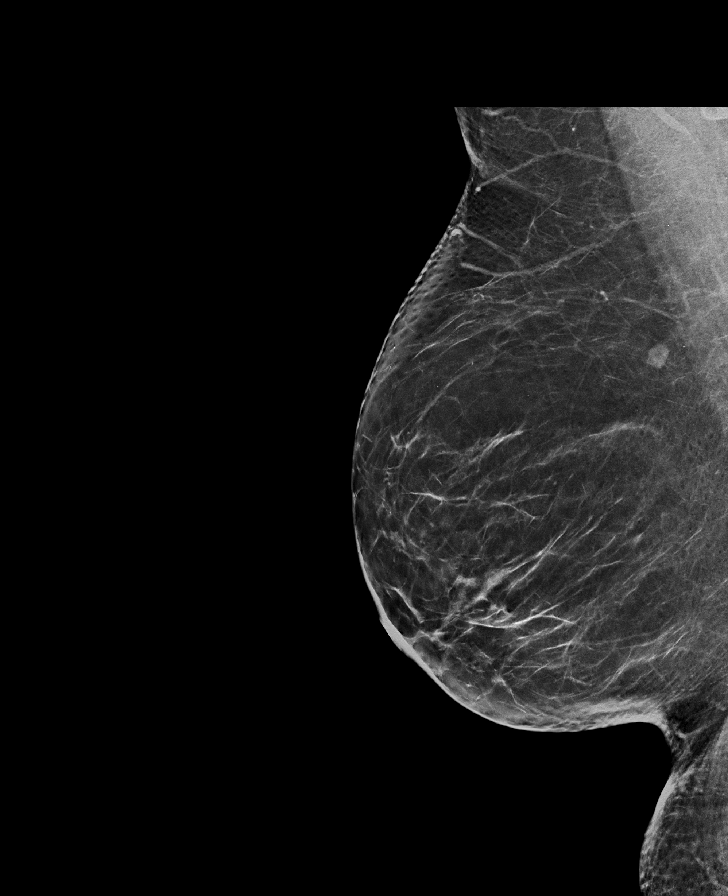

[L MLO synth-2D]
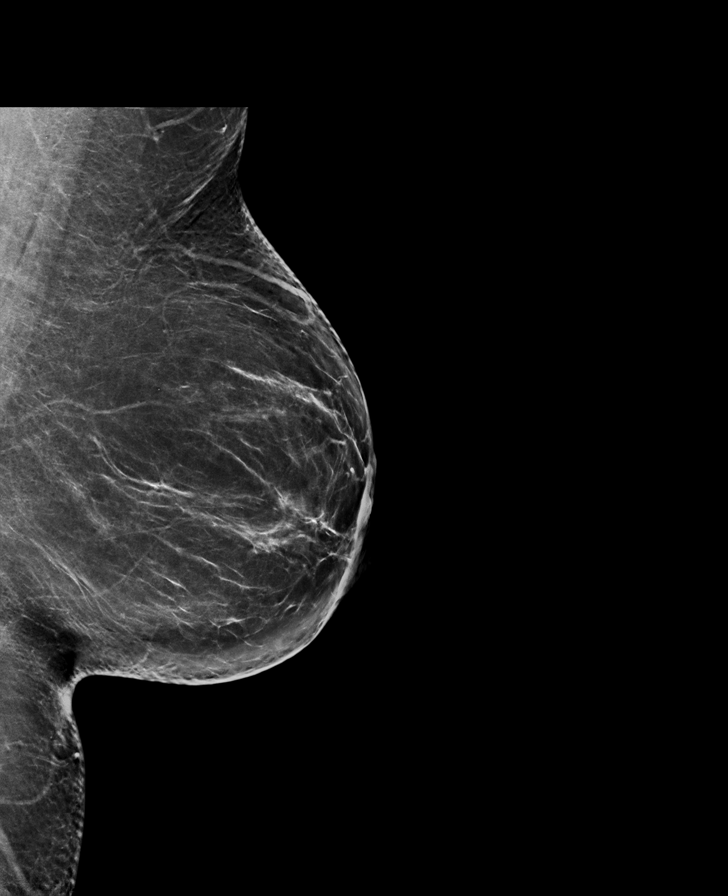

[R CC synth-2D]
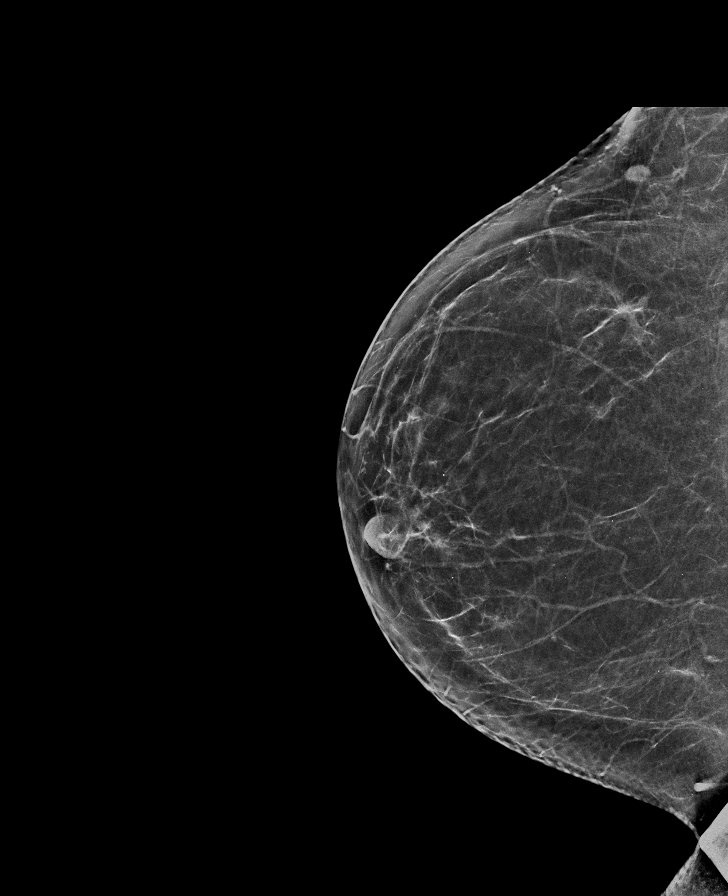

[L MLO tomo · tomo slice 41/81.0]
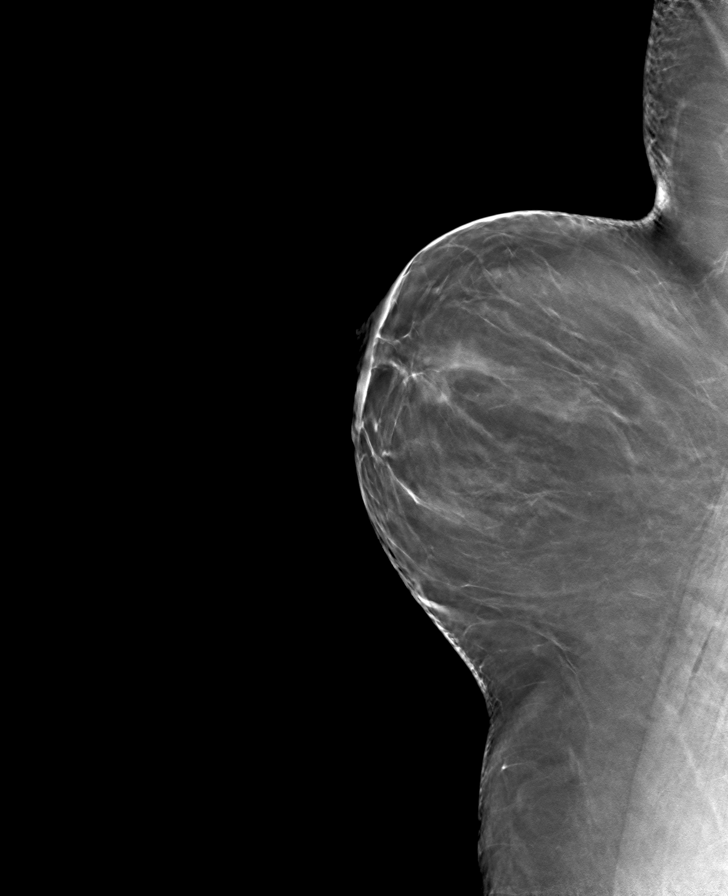

[L CC tomo · tomo slice 35/70.0]
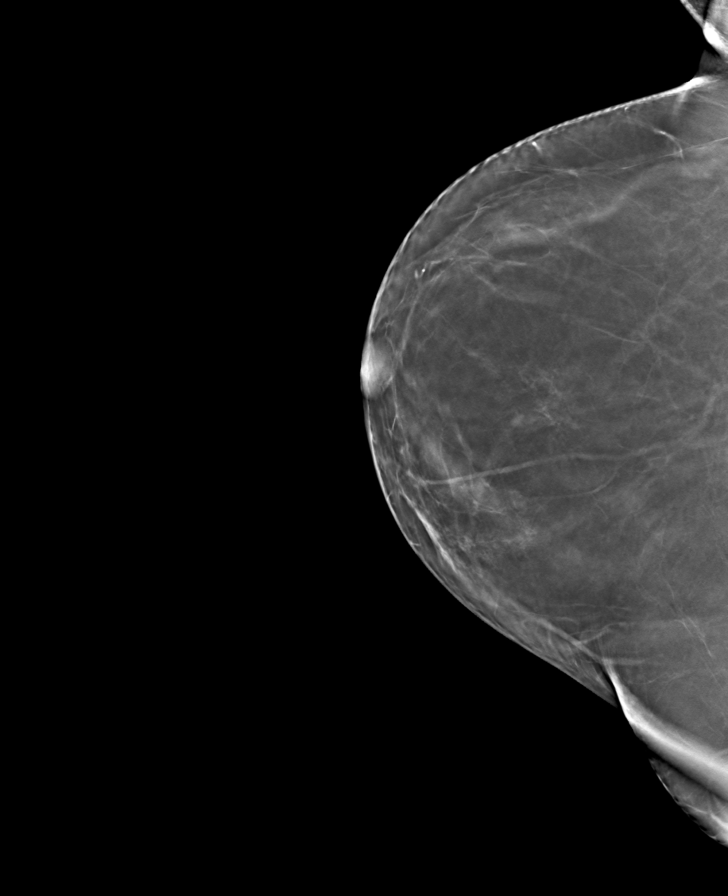

[R CC tomo · tomo slice 35/70.0]
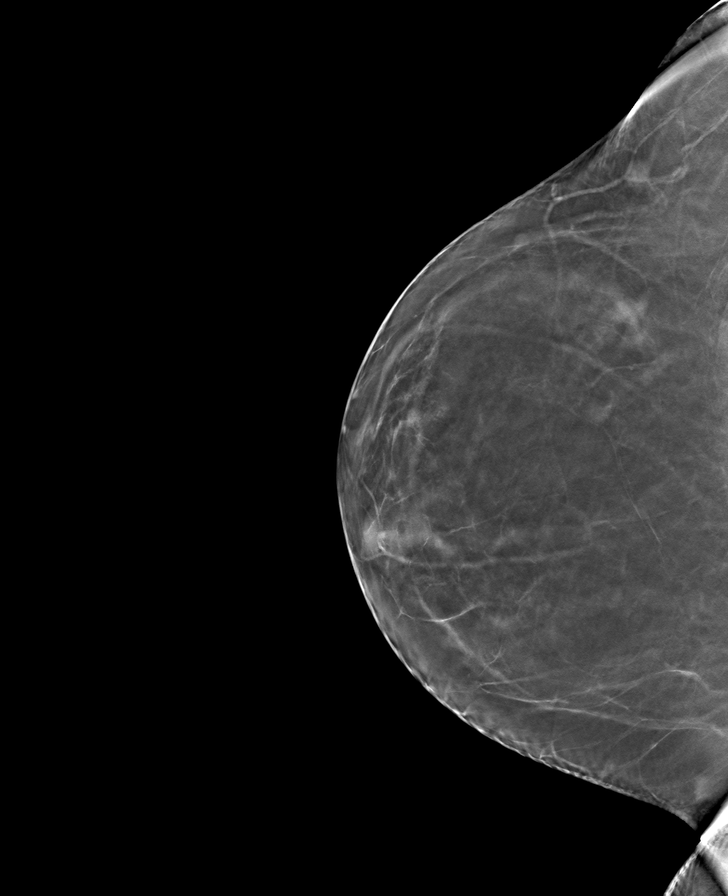

[R MLO tomo · tomo slice 39/78.0]
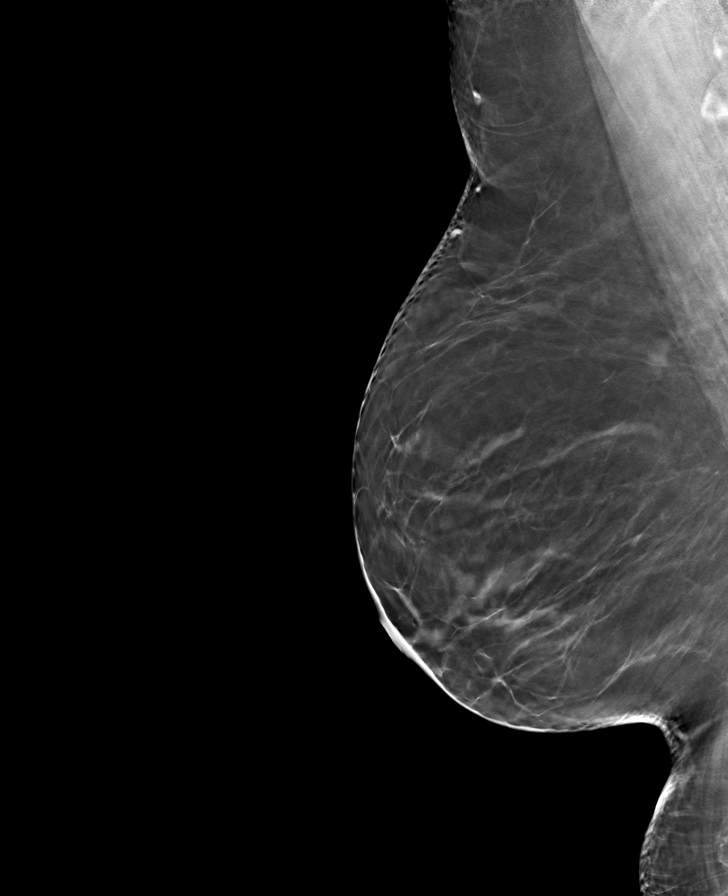

[8 of 24 positions shown; findings below may reference images not displayed]

ACR Breast Density Category b: There are scattered areas of
fibroglandular density.
FINDINGS: There are no findings suspicious for malignancy. The images were
evaluated with computer-aided detection.
IMPRESSION: No mammographic evidence of malignancy. A result letter of this
screening mammogram will be mailed directly to the patient.

RECOMMENDATION:
Screening mammogram in one year. (Code:C7-6-ASJ)

BI-RADS CATEGORY  1: Negative.

## 2022-08-07 ENCOUNTER — Other Ambulatory Visit: Payer: Self-pay | Admitting: Family Medicine

## 2022-08-07 DIAGNOSIS — Z1231 Encounter for screening mammogram for malignant neoplasm of breast: Secondary | ICD-10-CM

## 2022-09-28 ENCOUNTER — Ambulatory Visit
Admission: RE | Admit: 2022-09-28 | Discharge: 2022-09-28 | Disposition: A | Discharge status: Home / Self Care | Payer: Managed Care, Other (non HMO) | Source: Ambulatory Visit | Attending: Family Medicine | Admitting: Family Medicine

## 2022-09-28 DIAGNOSIS — Z1231 Encounter for screening mammogram for malignant neoplasm of breast: Secondary | ICD-10-CM

## 2023-03-15 ENCOUNTER — Emergency Department
Admission: EM | Admit: 2023-03-15 | Discharge: 2023-03-15 | Disposition: A | Discharge status: Home / Self Care | Payer: Managed Care, Other (non HMO) | Attending: Emergency Medicine | Admitting: Emergency Medicine

## 2023-03-15 ENCOUNTER — Other Ambulatory Visit: Payer: Self-pay

## 2023-03-15 ENCOUNTER — Emergency Department: Payer: Managed Care, Other (non HMO)

## 2023-03-15 DIAGNOSIS — Z1152 Encounter for screening for COVID-19: Secondary | ICD-10-CM | POA: Insufficient documentation

## 2023-03-15 DIAGNOSIS — R0789 Other chest pain: Secondary | ICD-10-CM | POA: Diagnosis present

## 2023-03-15 DIAGNOSIS — E876 Hypokalemia: Secondary | ICD-10-CM | POA: Insufficient documentation

## 2023-03-15 DIAGNOSIS — J111 Influenza due to unidentified influenza virus with other respiratory manifestations: Secondary | ICD-10-CM | POA: Insufficient documentation

## 2023-03-15 LAB — BASIC METABOLIC PANEL
Anion gap: 9 (ref 5–15)
BUN: 6 mg/dL (ref 6–20)
CO2: 19 mmol/L — ABNORMAL LOW (ref 22–32)
Calcium: 8.7 mg/dL — ABNORMAL LOW (ref 8.9–10.3)
Chloride: 105 mmol/L (ref 98–111)
Creatinine, Ser: 0.86 mg/dL (ref 0.44–1.00)
GFR, Estimated: >60 mL/min (ref >60)
Glucose, Bld: 162 mg/dL — ABNORMAL HIGH (ref 70–99)
Potassium: 2.9 mmol/L — ABNORMAL LOW (ref 3.5–5.1)
Sodium: 133 mmol/L — ABNORMAL LOW (ref 135–145)

## 2023-03-15 LAB — URINALYSIS, W/ REFLEX TO CULTURE (INFECTION SUSPECTED)
Bilirubin Urine: NEGATIVE
Glucose, UA: NEGATIVE mg/dL
Ketones, ur: NEGATIVE mg/dL
Nitrite: NEGATIVE
Protein, ur: 100 mg/dL — AB
RBC / HPF: >50 RBC/hpf (ref 0–5)
Specific Gravity, Urine: 1.002 — ABNORMAL LOW (ref 1.005–1.030)
pH: 6 (ref 5.0–8.0)

## 2023-03-15 LAB — CBC
HCT: 38.7 % (ref 36.0–46.0)
Hemoglobin: 13.7 g/dL (ref 12.0–15.0)
MCH: 32.2 pg (ref 26.0–34.0)
MCHC: 35.4 g/dL (ref 30.0–36.0)
MCV: 90.8 fL (ref 80.0–100.0)
Platelets: 231 10*3/uL (ref 150–400)
RBC: 4.26 MIL/uL (ref 3.87–5.11)
RDW: 12 % (ref 11.5–15.5)
WBC: 10.2 10*3/uL (ref 4.0–10.5)
nRBC: 0 % (ref 0.0–0.2)

## 2023-03-15 LAB — TROPONIN I (HIGH SENSITIVITY)
Troponin I (High Sensitivity): 3 ng/L (ref <18)
Troponin I (High Sensitivity): <2 ng/L (ref <18)

## 2023-03-15 LAB — MAGNESIUM: Magnesium: 1.9 mg/dL (ref 1.7–2.4)

## 2023-03-15 LAB — SARS CORONAVIRUS 2 BY RT PCR: SARS Coronavirus 2 by RT PCR: NEGATIVE

## 2023-03-15 LAB — POC URINE PREG, ED: Preg Test, Ur: NEGATIVE

## 2023-03-15 MED ORDER — POTASSIUM CHLORIDE CRYS ER 20 MEQ PO TBCR
60.0000 meq | EXTENDED_RELEASE_TABLET | Freq: Once | ORAL | Status: AC
  Administered 2023-03-15: 60 meq via ORAL
  Filled 2023-03-15: qty 3

## 2023-03-15 NOTE — ED Triage Notes (Signed)
Pt to ed from home via POV for CP, pressure in her chest when she takes a deep breath, has a headache and a fever. Pt fever at home 102. Pt is CAOx4, in no acute distress and ambulatory in triage.

## 2023-03-15 NOTE — Discharge Instructions (Addendum)
Your EKG, chest x-ray, and labs were all okay today.  Continue taking ibuprofen and/or Tylenol as needed and follow-up with your doctor.

## 2023-03-15 NOTE — ED Provider Notes (Signed)
[facility] Provider Note    Event Date/Time   First MD Initiated Contact with Patient 03/15/23 [phone]     (approximate)   History   Chief Complaint: Chest Pain (X 1 day)   HPI  Miranda Herrera is a 47 y.o. female with history of obesity on Wegovy for weight loss who comes ED complaining of chest pressure along with frontal headache and fever of 102 starting yesterday midday.  Occasional cough.  Fatigue.  Chills.  No exertional symptoms.  No vomiting or diarrhea.  Took ibuprofen at home with improvement of symptoms.  Children recently have had possible viral symptoms.     Physical Exam   Triage Vital Signs: ED Triage Vitals  Enc Vitals Group     BP 03/15/23 0021 (!) 122/58     Pulse Rate 03/15/23 0021 (!) 125     Resp 03/15/23 0021 20     Temp 03/15/23 0021 98 F (36.7 C)     Temp Source 03/15/23 0021 Oral     SpO2 03/15/23 0021 98 %     Weight 03/15/23 0018 238 lb 1.6 oz (108 kg)     Height 03/15/23 0018 5\' 5"  (1.651 m)     Head Circumference --      Peak Flow --      Pain Score 03/15/23 0018 5     Pain Loc --      Pain Edu? --      Excl. in GC? --     Most recent vital signs: Vitals:   03/15/23 0208 03/15/23 0449  BP: 105/69 (!) 107/58  Pulse: 95 85  Resp: 18 (!) 24  Temp: 98.4 F (36.9 C) 98.8 F (37.1 C)  SpO2: 96% 98%    General: Awake, no distress.  CV:  Good peripheral perfusion.  Regular rate and rhythm.  Normal distal pulses. Resp:  Normal effort.  Clear to auscultation bilaterally Abd:  No distention.  Soft nontender Other:  No lower extremity edema or calf tenderness.  Moist oral mucosa.   ED Results / Procedures / Treatments   Labs (all labs ordered are listed, but only abnormal results are displayed) Labs Reviewed  BASIC METABOLIC PANEL - Abnormal; Notable for the following components:      Result Value   Sodium 133 (*)    Potassium 2.9 (*)    CO2 19 (*)    Glucose, Bld 162 (*)    Calcium 8.7 (*)    All other  components within normal limits  URINALYSIS, W/ REFLEX TO CULTURE (INFECTION SUSPECTED) - Abnormal; Notable for the following components:   Color, Urine AMBER (*)    APPearance HAZY (*)    Specific Gravity, Urine 1.002 (*)    Hgb urine dipstick MODERATE (*)    Protein, ur 100 (*)    Leukocytes,Ua SMALL (*)    Bacteria, UA RARE (*)    All other components within normal limits  SARS CORONAVIRUS 2 BY RT PCR  CBC  MAGNESIUM  POC URINE PREG, ED  TROPONIN I (HIGH SENSITIVITY)  TROPONIN I (HIGH SENSITIVITY)     EKG Interpreted by me Sinus tachycardia rate 126.  Normal axis.  QTc prolonged at 608 ms.  Normal QRS ST segments and T waves  Repeat EKG shows sinus rhythm, rate of 84.  Normal axis, normal intervals, QTc 419.  Normal ST segments and T waves.  RADIOLOGY Chest x-ray interpreted by me, appears normal.  Radiology report reviewed   PROCEDURES:  Procedures   MEDICATIONS ORDERED IN ED: Medications  potassium chloride SA (KLOR-CON M) CR tablet 60 mEq (60 mEq Oral Given 03/15/23 0324)     IMPRESSION / MDM / ASSESSMENT AND PLAN / ED COURSE  I reviewed the triage vital signs and the nursing notes.  DDx: Influenza-like illness, COVID, pneumonia, electrolyte abnormality, AKI, non-STEMI, UTI  Patient's presentation is most consistent with acute presentation with potential threat to life or bodily function.  Patient presents with atypical chest pain and several other symptoms most consistent with influenza-like illness.  EKG chest x-ray and labs all unremarkable except for potassium of 2.9.  Initial EKG had a lot of artifact but showed QTc of 608, possibly artifactual.  Repeat EKG later is completely normal with QTc of 419 ms.  Patient nontoxic well-appearing.   Considering the patient's symptoms, medical history, and physical examination today, I have low suspicion for ACS, PE, TAD, pneumothorax, carditis, mediastinitis, pneumonia, CHF, or sepsis.    Clinical Course as of  03/15/23 0453  Mon Mar 15, 2023  0243 MCV: 90.8 [PS]    Clinical Course User Index [PS] Sharman Cheek, MD     FINAL CLINICAL IMPRESSION(S) / ED DIAGNOSES   Final diagnoses:  Influenza-like illness  Hypokalemia     Rx / DC Orders   ED Discharge Orders     None        Note:  This document was prepared using Dragon voice recognition software and may include unintentional dictation errors.   Sharman Cheek, MD 03/15/23 [phone]

## 2023-03-21 ENCOUNTER — Ambulatory Visit
Admission: EM | Admit: 2023-03-21 | Discharge: 2023-03-21 | Disposition: A | Discharge status: Home / Self Care | Payer: Managed Care, Other (non HMO) | Attending: Family Medicine | Admitting: Family Medicine

## 2023-03-21 DIAGNOSIS — L259 Unspecified contact dermatitis, unspecified cause: Secondary | ICD-10-CM | POA: Diagnosis not present

## 2023-03-21 MED ORDER — TRIAMCINOLONE ACETONIDE 0.1 % EX OINT: 1.0000 | TOPICAL_OINTMENT | Freq: Two times a day (BID) | CUTANEOUS | 0 refills | Status: DC

## 2023-03-21 MED ORDER — PREDNISONE 10 MG (21) PO TBPK: ORAL_TABLET | Freq: Every day | ORAL | 0 refills | Status: DC

## 2023-03-21 MED ORDER — DEXAMETHASONE SODIUM PHOSPHATE 10 MG/ML IJ SOLN
10.0000 mg | Freq: Once | INTRAMUSCULAR | Status: AC
  Administered 2023-03-21: 10 mg via INTRAMUSCULAR

## 2023-03-21 NOTE — ED Triage Notes (Signed)
Pt c/o rash all over body x2 wks. Has tried benadryl,calamine lotion,cortisone w/o relief.

## 2023-03-21 NOTE — Discharge Instructions (Addendum)
See handout on contact dermatitis. Stop by the pharmacy to pick up your prescriptions.  Follow up with your primary care provider as needed.  Take your Allegra twice a day. Take prednisone as prescribed. Use steroid ointment as needed but not more than 14 days.

## 2023-03-21 NOTE — ED Provider Notes (Signed)
[facility]    CSN: [id number] Arrival date & time: 03/21/23  1317      History   Chief Complaint Chief Complaint  Patient presents with   Rash    HPI Miranda Herrera is a 47 y.o. female.   HPI  Miranda Herrera presents for rash that started last Wedmesday.  She cleared off the front yard but Friday she noticed a small itchy area. Had fever 102.86F with temporal thermother. She went to ED. They sent her home Sunday. She has a low grade fever and rash.  She has new spots that keep coming up. She was on antibiotics for a UTI diagnosed   Sunday she had a terrible headache. Advil helped. She has dull headache. She wakes up with congestion and "gunk" in her chest. Of note, her son has been sick.      Past Medical History:  Diagnosis Date   Depression    PPD with 05/23/2017 delivery, wellbutrin   GDM (gestational diabetes mellitus) 07/21/2018   Diagnosed on 10/29. Declining IOL at 39 weeks, wants to 'wait until baby is ready to come on his own', see 12/3 note. 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   Negative 06/2018 anatomy u/s    Patient Active Problem List   Diagnosis Date Noted   BMI 40.0-44.9, adult (HCC) 06/28/2018    Past Surgical History:  Procedure Laterality Date   EYE SURGERY     lasik   TUBAL LIGATION N/A 09/16/2018   Procedure: POST PARTUM TUBAL LIGATION;  Surgeon: Tereso Newcomer, MD;  Location: [facility];  Service: Gynecology;  Laterality: N/A;    OB History     Gravida  4   Para  4   Term  4   Preterm  0   AB  0   Living  4      SAB  0   IAB  0   Ectopic  0   Multiple  0   Live Births  4            Home Medications    Prior to Admission medications   Medication Sig Start Date End Date Taking? Authorizing Provider  ACCU-CHEK FASTCLIX LANCETS MISC 1 each by Percutaneous route 4  (four) times daily. 07/26/18  Yes Anyanwu, Jethro Bastos, MD  predniSONE (STERAPRED UNI-PAK 21 TAB) 10 MG (21) TBPK tablet Take by mouth daily. Take 6 tabs by mouth daily for 1, then 5 tabs for 1 day, then 4 tabs for 1 day, then 3 tabs for 1 day, then 2 tabs for 1 day, then 1 tab for 1 day. 03/21/23  Yes Mira Balon, Seward Meth, DO  Semaglutide-Weight Management (WEGOVY) 2.4 MG/0.75ML SOAJ Inject 2.4 mg into the skin.   Yes [provider]  triamcinolone ointment (KENALOG) 0.1 % Apply 1 Application topically 2 (two) times daily. 03/21/23  Yes Zykerria Tanton, DO  ferrous sulfate 325 (65 FE) MG tablet Take 1 tablet (325 mg total) by mouth 2 (two) times daily with a meal. Patient not taking: No sig reported 09/17/18 02/17/21  Henderson Newcomer, MD  simethicone (MYLICON) 80 MG chewable tablet Chew 1 tablet (80 mg total) by mouth as needed for flatulence. Patient not taking: No sig reported 09/17/18 02/17/21  Henderson Newcomer, MD    Family History Family History  Problem Relation  Age of Onset   Diabetes Sister    Birth defects Sister        clubbed feet   Diabetes Maternal Uncle    Diabetes Paternal Grandmother    Cancer Paternal Grandmother        ovarian   Healthy Mother    Healthy Father    Breast cancer Neg Hx     Social History Social History   Tobacco Use   Smoking status: Never   Smokeless tobacco: Never  Vaping Use   Vaping Use: Never used  Substance Use Topics   Alcohol use: No   Drug use: No     Allergies   Patient has no known allergies.   Review of Systems Review of Systems :negative unless otherwise stated in HPI.      Physical Exam Triage Vital Signs ED Triage Vitals  Enc Vitals Group     BP      Pulse      Resp      Temp      Temp src      SpO2      Weight      Height      Head Circumference      Peak Flow      Pain Score      Pain Loc      Pain Edu?      Excl. in GC?    No data found.  Updated Vital Signs BP 96/68 (BP Location: Right Arm)    Pulse 71   Temp 99.1 F (37.3 C) (Oral)   Resp 16   Ht 5\' 5"  (1.651 m)   Wt 108 kg   LMP 03/15/2023   SpO2 97%   BMI 39.62 kg/m   Visual Acuity Right Eye Distance:   Left Eye Distance:   Bilateral Distance:    Right Eye Near:   Left Eye Near:    Bilateral Near:     Physical Exam  GEN: alert, well appearing female, in no acute distress  EYES: extra occular movements intact, no scleral injection or discharge CV: regular rate, brisk cap refill  RESP: no increased work of breathing MSK: no extremity edema, no gross deformities NEURO: alert, moves all extremities appropriately, normal gait PSYCH: Normal affect, appropriate speech and behavior  SKIN: warm and dry; erythematous patches and papules on trunk, LE, UE    UC Treatments / Results  Labs (all labs ordered are listed, but only abnormal results are displayed) Labs Reviewed - No data to display  EKG   Radiology No results found.  Procedures Procedures (including critical care time)  Medications Ordered in UC Medications  dexamethasone (DECADRON) injection 10 mg (10 mg Intramuscular Given 03/21/23 1605)    Initial Impression / Assessment and Plan / UC Course  I have reviewed the triage vital signs and the nursing notes.  Pertinent labs & imaging results that were available during my care of the patient were reviewed by me and considered in my medical decision making (see chart for details).     Patient is a 47 y.o. femalewho presents for rash.  Overall, patient is well-appearing and well-hydrated.  Vital signs stable.  Miranda Herrera is afebrile.  History and exam concerning for contact dermatitis. Given Decadron IM 30 mg here.  Treat with steroid taper and  ointment. Allergra BID for itching with Benadryl at bedtime. No sign of infection to suggest antibiotics at this time.  I also suspect patient has a viral respiratory illness. As  her son, had similar sx.    Reviewed expectations regarding course of current  medical issues.  All questions asked were answered.  Outlined signs and symptoms indicating need for more acute intervention. Patient verbalized understanding. After Visit Summary given.   Final Clinical Impressions(s) / UC Diagnoses   Final diagnoses:  Contact dermatitis, unspecified contact dermatitis type, unspecified trigger     Discharge Instructions      See handout on contact dermatitis. Stop by the pharmacy to pick up your prescriptions.  Follow up with your primary care provider as needed.  Take your Allegra twice a day. Take prednisone as prescribed. Use steroid ointment as needed but not more than 14 days.      ED Prescriptions     Medication Sig Dispense Auth. Provider   predniSONE (STERAPRED UNI-PAK 21 TAB) 10 MG (21) TBPK tablet Take by mouth daily. Take 6 tabs by mouth daily for 1, then 5 tabs for 1 day, then 4 tabs for 1 day, then 3 tabs for 1 day, then 2 tabs for 1 day, then 1 tab for 1 day. 21 tablet Tijah Hane, DO   triamcinolone ointment (KENALOG) 0.1 % Apply 1 Application topically 2 (two) times daily. 30 g Katha Cabal, DO      PDMP not reviewed this encounter.           Katha Cabal, DO 03/21/23 1641

## 2023-11-16 ENCOUNTER — Other Ambulatory Visit: Payer: Self-pay | Admitting: Family Medicine

## 2023-11-16 DIAGNOSIS — Z1231 Encounter for screening mammogram for malignant neoplasm of breast: Secondary | ICD-10-CM

## 2023-11-30 ENCOUNTER — Ambulatory Visit
Admission: RE | Admit: 2023-11-30 | Discharge: 2023-11-30 | Disposition: A | Discharge status: Home / Self Care | Payer: Managed Care, Other (non HMO) | Source: Ambulatory Visit | Attending: Family Medicine | Admitting: Family Medicine

## 2023-11-30 DIAGNOSIS — Z1231 Encounter for screening mammogram for malignant neoplasm of breast: Secondary | ICD-10-CM | POA: Insufficient documentation

## 2023-12-26 ENCOUNTER — Ambulatory Visit (INDEPENDENT_AMBULATORY_CARE_PROVIDER_SITE_OTHER)

## 2023-12-26 ENCOUNTER — Encounter: Payer: Self-pay | Admitting: Emergency Medicine

## 2023-12-26 ENCOUNTER — Ambulatory Visit
Admission: EM | Admit: 2023-12-26 | Discharge: 2023-12-26 | Disposition: A | Discharge status: Home / Self Care | Attending: Family Medicine | Admitting: Family Medicine

## 2023-12-26 DIAGNOSIS — R071 Chest pain on breathing: Secondary | ICD-10-CM

## 2023-12-26 DIAGNOSIS — S2231XA Fracture of one rib, right side, initial encounter for closed fracture: Secondary | ICD-10-CM | POA: Diagnosis not present

## 2023-12-26 MED ORDER — METHOCARBAMOL 500 MG PO TABS: 500.0000 mg | ORAL_TABLET | Freq: Two times a day (BID) | ORAL | 0 refills | Status: AC

## 2023-12-26 NOTE — ED Triage Notes (Signed)
 Patient states that when she woke up this morning she was having pain in her right shoulder that radiates to her back and down her right arm.  Patient also states that the pain is worse when she tries to take a deep breath.  Patient denies injury or fall.

## 2023-12-26 NOTE — ED Provider Notes (Signed)
 [facility]    CSN: [id number] Arrival date & time: 12/26/23  1026      History   Chief Complaint Chief Complaint  Patient presents with   Shoulder Pain    right    HPI  HPI Miranda Herrera is a 48 y.o. female.   [name] presents for right shoulder pain that started this morning that radiates to her lower right chest.  Pain gets worse with deep breathing. Has upper right flank pain.  She had similar sx when she had pneumonia in 2018.  She is concerned that she may have pneumonia again as she has a slight dry cough.  No fever or shortness of breath. Denies history of PE/DVT, recent travel, cancer history, leg swelling or dyspnea with exertion. There has been no heavy lifting, trauma or falls. Nothing taken for pain. Pain 5/10 but when she first woke up pain was worse. Felt like "fire" with deep breathing but this has greatly improved.   Took allergra D and flonase for her allergies.     Past Medical History:  Diagnosis Date   Depression    PPD with 05/23/2017 delivery, wellbutrin   GDM (gestational diabetes mellitus) 07/21/2018   Diagnosed on 10/29. Declining IOL at 39 weeks, wants to 'wait until baby is ready to come on his own', see 12/3 note. 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   Negative 06/2018 anatomy u/s    Patient Active Problem List   Diagnosis Date Noted   BMI 40.0-44.9, adult (HCC) 06/28/2018    Past Surgical History:  Procedure Laterality Date   EYE SURGERY     lasik   TUBAL LIGATION N/A 09/16/2018   Procedure: POST PARTUM TUBAL LIGATION;  Surgeon: Julianne Octave, MD;  Location: [facility];  Service: Gynecology;  Laterality: N/A;    OB History     Gravida  4   Para  4   Term  4   Preterm  0   AB  0   Living  4      SAB  0   IAB  0   Ectopic  0   Multiple  0   Live Births  4             Home Medications    Prior to Admission medications   Medication Sig Start Date End Date Taking? Authorizing Provider  methocarbamol (ROBAXIN) 500 MG tablet Take 1 tablet (500 mg total) by mouth 2 (two) times daily. 12/26/23  Yes Mee Macdonnell, DO  ACCU-CHEK FASTCLIX LANCETS MISC 1 each by Percutaneous route 4 (four) times daily. 07/26/18   Anyanwu, Ugonna A, MD  Semaglutide-Weight Management (WEGOVY) 2.4 MG/0.75ML SOAJ Inject 2.4 mg into the skin.    [provider]  ferrous sulfate 325 (65 FE) MG tablet Take 1 tablet (325 mg total) by mouth 2 (two) times daily with a meal. Patient not taking: No sig reported 09/17/18 02/17/21  Torrance Freestone, MD  simethicone (MYLICON) 80 MG chewable tablet Chew 1 tablet (80 mg total) by mouth as needed for flatulence. Patient not taking: No sig reported 09/17/18 02/17/21  Torrance Freestone, MD    Family History Family History  Problem Relation Age of Onset   Diabetes Sister    Birth defects Sister        clubbed feet  Diabetes Maternal Uncle    Diabetes Paternal Grandmother    Cancer Paternal Grandmother        ovarian   Healthy Mother    Healthy Father    Breast cancer Neg Hx     Social History Social History   Tobacco Use   Smoking status: Never   Smokeless tobacco: Never  Vaping Use   Vaping status: Never Used  Substance Use Topics   Alcohol use: No   Drug use: No     Allergies   Patient has no known allergies.   Review of Systems Review of Systems: :negative unless otherwise stated in HPI.      Physical Exam Triage Vital Signs ED Triage Vitals  Encounter Vitals Group     BP 12/26/23 1038 106/70     Systolic BP Percentile --      Diastolic BP Percentile --      Pulse Rate 12/26/23 1038 73     Resp 12/26/23 1038 14     Temp 12/26/23 1038 98 F (36.7 C)     Temp Source 12/26/23 1038 Oral     SpO2 12/26/23 1038 100 %     Weight 12/26/23 1036 238 lb 1.6 oz (108 kg)     Height 12/26/23 1036 5'  5" (1.651 m)     Head Circumference --      Peak Flow --      Pain Score 12/26/23 1036 5     Pain Loc --      Pain Education --      Exclude from Growth Chart --    No data found.  Updated Vital Signs BP 106/70 (BP Location: Left Arm)   Pulse 73   Temp 98 F (36.7 C) (Oral)   Resp 14   Ht 5\' 5"  (1.651 m)   Wt 108 kg   LMP 12/12/2023 (Approximate)   SpO2 100%   Breastfeeding No   BMI 39.62 kg/m   Visual Acuity Right Eye Distance:   Left Eye Distance:   Bilateral Distance:    Right Eye Near:   Left Eye Near:    Bilateral Near:     Physical Exam GEN: well appearing female in no acute distress  CVS: well perfused, regular rate and rhythm Chest wall: Tenderness along the right mid-ribs  RESP: speaking in full sentences without pause, no respiratory distress, rhonchi bilaterally, no rales, no wheezing MSK: Baseline strength and range of motion Skin: Warm and dry   UC Treatments / Results  Labs (all labs ordered are listed, but only abnormal results are displayed) Labs Reviewed - No data to display  EKG   Radiology No results found.   Procedures Procedures (including critical care time)  Medications Ordered in UC Medications - No data to display  Initial Impression / Assessment and Plan / UC Course  I have reviewed the triage vital signs and the nursing notes.  Pertinent labs & imaging results that were available during my care of the patient were reviewed by me and considered in my medical decision making (see chart for details).      Pt is a 48 y.o.  female chest discomfort with cough. Rhiannan has no history of PE.  Able to [facility] pt out therefore doubt PE.  EKG obtained and is without ST elevations or concern for ischemia; compared to EKG from 03/15/2019 for long QT, tachycardia and short PR interval have resolved.  Labs deferred today.  Chest x-ray and right rib series did  not show bacterial pneumonia, pleural effusions or pneumothorax.  Per the  radiologist, she has a remote lateral 6th rib fracture. She is not aware of this.  I do not think his chest pain is cardiopulmonary related. Reassurance provided as patient mostly concerned that she may have had pneumonia.  Pain is somewhat reproducible on exam therefore may be MSK related.  Treat with muscle relaxer as below.       Final Clinical Impressions(s) / UC Diagnoses   Final diagnoses:  Chest pain on breathing  Closed fracture of one rib of right side, initial encounter     Discharge Instructions      Your x-ray showed no pneumonia but you did have a old 6th rib fracture on the right side.   Take Tylenol and/or ibuprofen as needed for pain. I sent a muscle relaxer to your pharmacy.  Take at bedtime then twice a day as needed for pain.      ED Prescriptions     Medication Sig Dispense Auth. Provider   methocarbamol (ROBAXIN) 500 MG tablet Take 1 tablet (500 mg total) by mouth 2 (two) times daily. 20 tablet Annina Piotrowski, DO      PDMP not reviewed this encounter.   Fidel Huddle, DO 01/04/24 2338

## 2023-12-26 NOTE — Discharge Instructions (Addendum)
 Your x-ray showed no pneumonia but you did have a old 6th rib fracture on the right side.   Take Tylenol and/or ibuprofen as needed for pain. I sent a muscle relaxer to your pharmacy.  Take at bedtime then twice a day as needed for pain.

## 2024-02-07 ENCOUNTER — Ambulatory Visit: Payer: Self-pay

## 2024-02-07 DIAGNOSIS — K640 First degree hemorrhoids: Secondary | ICD-10-CM | POA: Diagnosis not present

## 2024-02-07 DIAGNOSIS — Z1211 Encounter for screening for malignant neoplasm of colon: Secondary | ICD-10-CM | POA: Diagnosis present

## 2024-02-07 DIAGNOSIS — D122 Benign neoplasm of ascending colon: Secondary | ICD-10-CM | POA: Diagnosis not present

## 2024-02-07 DIAGNOSIS — K5730 Diverticulosis of large intestine without perforation or abscess without bleeding: Secondary | ICD-10-CM | POA: Diagnosis not present

## 2024-02-07 DIAGNOSIS — D125 Benign neoplasm of sigmoid colon: Secondary | ICD-10-CM | POA: Diagnosis not present
